# Patient Record
Sex: Male | Born: 1960 | Race: White | Hispanic: No | Marital: Married | Smoking: Never smoker
Health system: Southern US, Community
[De-identification: ages and names within clinical notes are randomized; demographics above are authoritative.]

## PROBLEM LIST (undated history)

## (undated) DIAGNOSIS — Z9989 Dependence on other enabling machines and devices: Secondary | ICD-10-CM

## (undated) DIAGNOSIS — M1990 Unspecified osteoarthritis, unspecified site: Secondary | ICD-10-CM

## (undated) DIAGNOSIS — Z973 Presence of spectacles and contact lenses: Secondary | ICD-10-CM

## (undated) DIAGNOSIS — N400 Enlarged prostate without lower urinary tract symptoms: Secondary | ICD-10-CM

## (undated) DIAGNOSIS — G4730 Sleep apnea, unspecified: Secondary | ICD-10-CM

## (undated) DIAGNOSIS — Z9289 Personal history of other medical treatment: Secondary | ICD-10-CM

## (undated) DIAGNOSIS — S46219A Strain of muscle, fascia and tendon of other parts of biceps, unspecified arm, initial encounter: Secondary | ICD-10-CM

## (undated) DIAGNOSIS — K449 Diaphragmatic hernia without obstruction or gangrene: Secondary | ICD-10-CM

## (undated) DIAGNOSIS — I10 Essential (primary) hypertension: Secondary | ICD-10-CM

## (undated) DIAGNOSIS — Z8719 Personal history of other diseases of the digestive system: Secondary | ICD-10-CM

## (undated) DIAGNOSIS — L502 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 and heat: Secondary | ICD-10-CM

## (undated) DIAGNOSIS — G43909 Migraine, unspecified, not intractable, without status migrainosus: Secondary | ICD-10-CM

## (undated) DIAGNOSIS — K5790 Diverticulosis of intestine, part unspecified, without perforation or abscess without bleeding: Secondary | ICD-10-CM

## (undated) DIAGNOSIS — E785 Hyperlipidemia, unspecified: Secondary | ICD-10-CM

## (undated) DIAGNOSIS — Z8709 Personal history of other diseases of the respiratory system: Secondary | ICD-10-CM

## (undated) DIAGNOSIS — J302 Other seasonal allergic rhinitis: Secondary | ICD-10-CM

## (undated) DIAGNOSIS — A159 Respiratory tuberculosis unspecified: Secondary | ICD-10-CM

## (undated) DIAGNOSIS — G4733 Obstructive sleep apnea (adult) (pediatric): Secondary | ICD-10-CM

## (undated) HISTORY — DX: Hyperlipidemia, unspecified: E78.5

## (undated) HISTORY — DX: Migraine, unspecified, not intractable, without status migrainosus: G43.909

## (undated) HISTORY — DX: Essential (primary) hypertension: I10

## (undated) HISTORY — DX: Diverticulosis of intestine, part unspecified, without perforation or abscess without bleeding: K57.90

## (undated) HISTORY — DX: Benign prostatic hyperplasia without lower urinary tract symptoms: N40.0

## (undated) HISTORY — DX: Unspecified osteoarthritis, unspecified site: M19.90

## (undated) HISTORY — PX: COLONOSCOPY: SHX174

## (undated) HISTORY — DX: Sleep apnea, unspecified: G47.30

## (undated) HISTORY — PX: UMBILICAL HERNIA REPAIR: SHX196

## (undated) HISTORY — DX: Respiratory tuberculosis unspecified: A15.9

## (undated) HISTORY — PX: HERNIA REPAIR: SHX51

---
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
## 1996-08-13 HISTORY — PX: CHOLECYSTECTOMY: SHX55

## 1996-08-13 HISTORY — PX: LAPAROSCOPIC CHOLECYSTECTOMY: SUR755

## 2015-08-14 HISTORY — PX: UMBILICAL HERNIA REPAIR: SHX196

## 2015-09-13 LAB — LIPID PANEL
Cholesterol: 182 mg/dL (ref 0–200)
HDL: 49 mg/dL (ref 35–70)
LDL CALC: 105 mg/dL
TRIGLYCERIDES: 141 mg/dL (ref 40–160)

## 2015-09-13 LAB — HEMOGLOBIN A1C: HEMOGLOBIN A1C: 5.5

## 2015-09-13 LAB — BASIC METABOLIC PANEL: Glucose: 92 mg/dL

## 2016-03-26 LAB — BASIC METABOLIC PANEL: Glucose: 124 mg/dL

## 2016-04-11 ENCOUNTER — Ambulatory Visit (INDEPENDENT_AMBULATORY_CARE_PROVIDER_SITE_OTHER): Payer: PRIVATE HEALTH INSURANCE | Admitting: Family Medicine

## 2016-04-11 ENCOUNTER — Encounter: Payer: Self-pay | Admitting: Family Medicine

## 2016-04-11 VITALS — BP 124/80 | HR 60 | Resp 18 | Ht 76.25 in | Wt 233.0 lb

## 2016-04-11 DIAGNOSIS — I10 Essential (primary) hypertension: Secondary | ICD-10-CM | POA: Insufficient documentation

## 2016-04-11 DIAGNOSIS — N400 Enlarged prostate without lower urinary tract symptoms: Secondary | ICD-10-CM

## 2016-04-11 DIAGNOSIS — L502 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 and heat: Secondary | ICD-10-CM

## 2016-04-11 DIAGNOSIS — Z23 Encounter for immunization: Secondary | ICD-10-CM | POA: Diagnosis not present

## 2016-04-11 DIAGNOSIS — Z7689 Persons encountering health services in other specified circumstances: Secondary | ICD-10-CM

## 2016-04-11 DIAGNOSIS — Z91048 Other nonmedicinal substance allergy status: Secondary | ICD-10-CM

## 2016-04-11 DIAGNOSIS — G43909 Migraine, unspecified, not intractable, without status migrainosus: Secondary | ICD-10-CM

## 2016-04-11 DIAGNOSIS — G4733 Obstructive sleep apnea (adult) (pediatric): Secondary | ICD-10-CM

## 2016-04-11 DIAGNOSIS — Z7189 Other specified counseling: Secondary | ICD-10-CM

## 2016-04-11 DIAGNOSIS — F5104 Psychophysiologic insomnia: Secondary | ICD-10-CM | POA: Insufficient documentation

## 2016-04-11 DIAGNOSIS — G4700 Insomnia, unspecified: Secondary | ICD-10-CM

## 2016-04-11 DIAGNOSIS — Z9109 Other allergy status, other than to drugs and biological substances: Secondary | ICD-10-CM | POA: Insufficient documentation

## 2016-04-11 DIAGNOSIS — Z9989 Dependence on other enabling machines and devices: Secondary | ICD-10-CM | POA: Insufficient documentation

## 2016-04-11 NOTE — Patient Instructions (Signed)
Need release for old records from primary care doctor  Follow up with neurologist as recomended  Good work with the healthy diet and exercise  Flu shot today   I will refer for sleep consult  See me in January for a physical and lab work

## 2016-04-11 NOTE — Progress Notes (Signed)
Chief Complaint  Patient presents with  . Establish Care    previously pcp was Dr. Shawnee KnappLevy in [place]   Patient is a pleasant 55 year old gentleman here to establish as a new patient. 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 [facility] and these records are requested.  Patient is under the care of a neurologist for active migraine condition. He has migraines at least 6 times a month. He has been referred to a new neurologist in [place] and has an appointment next month. He uses the zolpidem as needed for migraine headaches, sleep helps the headaches.  He was diagnosed with sleep apnea many years ago. He is compliant with his CPAP. He requests a reevaluation by sleep medicine doctor. A referral will be placed for him.  He has well-controlled hypertension. No other cardiovascular disease, heart disease.  He sees an eye doctor yearly. He goes to a dentist regularly. He eats well and exercises. Immunizations are up-to-date. Health maintenance is up-to-date.  Patient Active Problem List   Diagnosis Date Noted  . Essential hypertension 04/11/2016  . Migraine 04/11/2016  . 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 Environmental allergies 04/11/2016  . BPH (benign prostatic hyperplasia) 04/11/2016  . OSA on CPAP 04/11/2016  . Chronic insomnia 04/11/2016    Outpatient Encounter Prescriptions as of 04/11/2016  Medication Sig  . butorphanol (STADOL) 10 MG/ML nasal spray Place 1 spray into the nose as needed.  Marland Kitchen. lisinopril (PRINIVIL,ZESTRIL) 10 MG tablet Take 10 mg by mouth daily.  . Multiple Vitamin (MULTIVITAMIN) tablet Take 1 tablet by mouth daily.  . Omega-3 Fatty Acids (FISH OIL) 1000 MG CAPS Take by mouth.  . SUMAtriptan (IMITREX) 100 MG tablet   . SUMAtriptan (IMITREX) 6 MG/0.5ML SOLN injection INJECT 1 VIAL SUBCUTANEOUSLY AT ONSET OF HEADACHE. MAY REPEAT 1 TIME 1 HOUR LATER. MAX OF 2 DOSES IN 24 HOURS PRF MIGRAINES.   Marland Kitchen. tamsulosin (FLOMAX) 0.4 MG CAPS capsule Take 0.4 mg by mouth daily.  Marland Kitchen. triamcinolone (NASACORT ALLERGY 24HR) 55 MCG/ACT AERO nasal inhaler Place 2 sprays into the nose daily.  Marland Kitchen. zolpidem (AMBIEN) 10 MG tablet Take 10 mg by mouth at bedtime as needed for sleep.   No facility-administered encounter medications on file as of 04/11/2016.    Family History  Problem Relation Age of Onset  . Mental illness Mother     died of trauma  . Cancer Father     kidney  . Thalassemia Father   . Thalassemia Brother   . Mental illness Son   . Mental illness Brother     opiod overdose  . Cancer Paternal Grandfather     stomach  . Alcohol abuse Paternal Grandfather    Social History   Social History  . Marital status: Married    Spouse name: N/A  . Number of children: 2 sons  . Years of education: N/A   Occupational History  . Not on file.   Social History Main Topics  . Smoking status: Never Smoker  . Smokeless tobacco: Former NeurosurgeonUser    Types: Snuff  . Alcohol use No  . Drug use: No  . Sexual activity: Yes    Birth control/ protection: Post-menopausal   Past Surgical History:  Procedure Laterality Date  . CHOLECYSTECTOMY  1998  . HERNIA REPAIR     umbilical  . TONSILECTOMY, ADENOIDECTOMY, BILATERAL MYRINGOTOMY AND TUBES  1968  . UMBILICAL HERNIA REPAIR  08/2015  BP 124/80   Pulse 60   Resp 18   Ht 6' 4.25" (1.937 m)   Wt 233 lb (105.7 kg)   SpO2 98%   BMI 28.18 kg/m  Review of Systems  Constitutional: Negative for chills, fever and weight loss.  HENT: Negative for congestion and hearing loss.   Eyes: Negative for blurred vision and pain.  Respiratory: Negative for cough and shortness of breath.   Cardiovascular: Negative for chest pain and leg swelling.  Gastrointestinal: Negative for abdominal pain, constipation, diarrhea and heartburn.  Genitourinary: Negative for dysuria and frequency.  Musculoskeletal: Negative for falls, joint pain and myalgias.  Skin:        "bump" on back  Neurological: Negative for dizziness, seizures and headaches.  Psychiatric/Behavioral: Negative for depression. The patient has insomnia. The patient is not nervous/anxious.    Physical Exam  Constitutional: He is oriented to person, place, and time. He appears well-developed and well-nourished.  HENT:  Head: Normocephalic and atraumatic.  Right Ear: External ear normal.  Left Ear: External ear normal.  Mouth/Throat: Oropharynx is clear and moist.  Eyes: Conjunctivae are normal. Pupils are equal, round, and reactive to light.  Mild strabismus  Neck: Normal range of motion. Neck supple. No thyromegaly present.  Cardiovascular: Normal rate, regular rhythm and normal heart sounds.   Pulmonary/Chest: Effort normal and breath sounds normal.  Abdominal: Soft. Bowel sounds are normal. He exhibits no distension. There is no tenderness.  Musculoskeletal: Normal range of motion. He exhibits no edema.  Lymphadenopathy:    He has no cervical adenopathy.  Neurological: He is alert and oriented to person, place, and time.  Skin: Skin is warm and dry.  Soft mobile mass sub cutaneous overlying R scapula  Psychiatric: He has a normal mood and affect. His behavior is normal. Judgment and thought content normal.   1. Encounter to establish care with new doctor   2. Essential hypertension  - COMPLETE METABOLIC PANEL WITH GFR - Lipid Profile - Vitamin D (25 hydroxy)  3. Migraine without status migrainosus, not intractable, unspecified migraine type Continue seeing neurology  4. 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 Environmental allergies nasocort  6. BPH (benign prostatic hyperplasia) tamulosin  7. OSA on CPAP Referral placed  8. Encounter for immunization  - Flu Vaccine QUAD 36+ mos IM  9. Chronic insomnia Discussed prn zolpidem for headache and sleep  Patient Instructions  Need release for old records from primary care doctor  Follow up with neurologist as  recomended  Good work with the healthy diet and exercise  Flu shot today   I will refer for sleep consult  See me in January for a physical and lab work

## 2016-04-17 ENCOUNTER — Encounter: Payer: Self-pay | Admitting: Family Medicine

## 2016-04-17 DIAGNOSIS — K5732 Diverticulitis of large intestine without perforation or abscess without bleeding: Secondary | ICD-10-CM | POA: Insufficient documentation

## 2016-05-10 ENCOUNTER — Encounter: Payer: Self-pay | Admitting: Family Medicine

## 2016-05-10 ENCOUNTER — Ambulatory Visit (INDEPENDENT_AMBULATORY_CARE_PROVIDER_SITE_OTHER): Payer: PRIVATE HEALTH INSURANCE | Admitting: Family Medicine

## 2016-05-10 VITALS — BP 120/64 | HR 70 | Resp 18 | Ht 76.25 in | Wt 231.0 lb

## 2016-05-10 DIAGNOSIS — E785 Hyperlipidemia, unspecified: Secondary | ICD-10-CM

## 2016-05-10 DIAGNOSIS — N3090 Cystitis, unspecified without hematuria: Secondary | ICD-10-CM

## 2016-05-10 DIAGNOSIS — Z9289 Personal history of other medical treatment: Secondary | ICD-10-CM | POA: Insufficient documentation

## 2016-05-10 HISTORY — DX: Hyperlipidemia, unspecified: E78.5

## 2016-05-10 LAB — POCT URINALYSIS DIPSTICK
Bilirubin, UA: NEGATIVE
Blood, UA: NEGATIVE
Glucose, UA: NEGATIVE
Ketones, UA: NEGATIVE
LEUKOCYTES UA: NEGATIVE
Nitrite, UA: NEGATIVE
PH UA: 6
PROTEIN UA: NEGATIVE
Spec Grav, UA: 1.025
UROBILINOGEN UA: 0.2

## 2016-05-10 MED ORDER — CIPROFLOXACIN HCL 500 MG PO TABS: 500.0000 mg | ORAL_TABLET | Freq: Two times a day (BID) | ORAL | 0 refills | Status: DC

## 2016-05-10 NOTE — Progress Notes (Signed)
Chief Complaint  Patient presents with  . Urinary Tract Infection    symptoms x 3 days of frequency and urgency    Patient has been on Flomax for many years for 9 prostatic hypertrophy. He has not had any symptoms of frequency and nocturia. He has never had a urinary tract infection before. He states that for the last 3 days he's noted that he has more frequent urination. No burning. No hematuria. No discolored or foul-smelling urine. No abdominal or suprapubic pain. No fever or chills. No flank pain. 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 It was recommended by the occupational physician that he go to his primary care doctor for evaluation.  Patient Active Problem List   Diagnosis Date Noted  . 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 HLD (hyperlipidemia) 05/10/2016  . Diverticulitis of colon 04/17/2016  . Essential hypertension 04/11/2016  . Migraine 04/11/2016  . 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 Environmental allergies 04/11/2016  . BPH (benign prostatic hyperplasia) 04/11/2016  . OSA on CPAP 04/11/2016  . Chronic insomnia 04/11/2016    Outpatient Encounter Prescriptions as of 05/10/2016  Medication Sig  . butorphanol (STADOL) 10 MG/ML nasal spray Place 1 spray into the nose as needed.  Marland Kitchen lisinopril (PRINIVIL,ZESTRIL) 10 MG tablet Take 10 mg by mouth daily.  . Multiple Vitamin (MULTIVITAMIN) tablet Take 1 tablet by mouth daily.  . Omega-3 Fatty Acids (FISH OIL) 1000 MG CAPS Take by mouth.  . SUMAtriptan (IMITREX) 100 MG tablet   . SUMAtriptan (IMITREX) 6 MG/0.5ML SOLN injection INJECT 1 VIAL SUBCUTANEOUSLY AT ONSET OF HEADACHE. MAY REPEAT 1 TIME 1 HOUR LATER. MAX OF 2 DOSES IN 24 HOURS PRF MIGRAINES.  Marland Kitchen tamsulosin (FLOMAX) 0.4 MG CAPS capsule Take 0.4 mg by mouth daily.  Marland Kitchen triamcinolone (NASACORT ALLERGY 24HR) 55 MCG/ACT AERO nasal inhaler Place 2 sprays into the nose daily.  Marland Kitchen zolpidem (AMBIEN) 10 MG tablet Take 10  mg by mouth at bedtime as needed for sleep.  . ciprofloxacin (CIPRO) 500 MG tablet Take 1 tablet (500 mg total) by mouth 2 (two) times daily.   No facility-administered encounter medications on file as of 05/10/2016.     Allergies  Allergen Reactions  . Amlodipine Swelling  . Nifedipine Swelling and Other (See Comments)    Daily migraines     Review of Systems  Constitutional: Negative for activity change, chills and fever.  HENT: Negative.  Negative for congestion and sore throat.   Eyes: Negative.  Negative for visual disturbance.  Respiratory: Negative.  Negative for cough and shortness of breath.   Cardiovascular: Negative.  Negative for chest pain, palpitations and leg swelling.  Gastrointestinal: Negative.  Negative for abdominal pain, nausea and vomiting.  Genitourinary: Positive for frequency. Negative for difficulty urinating, dysuria, flank pain and hematuria.  Musculoskeletal: Negative.  Negative for back pain.  Neurological: Negative.  Negative for dizziness and headaches.  Psychiatric/Behavioral: Negative.      BP 120/64   Pulse 70   Resp 18   Ht 6' 4.25" (1.937 m)   Wt 231 lb (104.8 kg)   SpO2 98%   BMI 27.93 kg/m   Physical Exam  Constitutional: He is oriented to person, place, and time. He appears well-developed and well-nourished. No distress.  HENT:  Head: Normocephalic.  Mouth/Throat: Oropharynx is clear and moist.  Eyes: Conjunctivae are normal. Pupils are equal, round, and reactive to light.  Neck:  Normal range of motion.  Cardiovascular: Normal rate, regular rhythm and normal heart sounds.   Pulmonary/Chest: Effort normal and breath sounds normal.  No CVAT  Abdominal: Soft. Bowel sounds are normal. There is no tenderness.  No organomegaly  Lymphadenopathy:    He has no cervical adenopathy.  Neurological: He is alert and oriented to person, place, and time.  Skin: Skin is warm and dry. No rash noted.  Psychiatric: He has a normal mood and  affect. His behavior is normal. Thought content normal.   Urinalysis in this office is negative. We'll send for culture ASSESSMENT/PLAN:  1. Cystitis  - POCT urinalysis dipstick - Urine culture  2. HLD (hyperlipidemia) Discussed that the patient has a Framingham risk of 13.2 based on his old records received. He has well-controlled hypertension. No family history. He does exercise regularly. He feels well. We did discuss cholesterol foods. He does not want to start a statin at this time, prefers to try harder with diet and exercise. He like to lose a little weight. We'll recheck his lipid panel in a few months.  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 He states he's never been treated for tuberculosis. With his next blood draw I am going to order a QuantiFERON Gold test and decide how this should be managed.   Patient Instructions  Take the cipro as directed Push fluids Call for problems   Eustace MooreYvonne Sue Herb Beltre, MD

## 2016-05-10 NOTE — Patient Instructions (Signed)
Take the cipro as directed Push fluids Call for problems

## 2016-05-11 ENCOUNTER — Other Ambulatory Visit (HOSPITAL_COMMUNITY)
Admission: RE | Admit: 2016-05-11 | Discharge: 2016-05-11 | Disposition: A | Discharge status: Home / Self Care | Payer: PRIVATE HEALTH INSURANCE | Source: Other Acute Inpatient Hospital | Attending: Family Medicine | Admitting: Family Medicine

## 2016-05-11 DIAGNOSIS — N3090 Cystitis, unspecified without hematuria: Secondary | ICD-10-CM | POA: Diagnosis present

## 2016-05-13 LAB — URINE CULTURE: CULTURE: NO GROWTH

## 2016-05-14 ENCOUNTER — Encounter: Payer: Self-pay | Admitting: Family Medicine

## 2016-07-04 ENCOUNTER — Telehealth: Payer: Self-pay | Admitting: Family Medicine

## 2016-07-04 ENCOUNTER — Other Ambulatory Visit: Payer: Self-pay

## 2016-07-04 ENCOUNTER — Other Ambulatory Visit: Payer: Self-pay | Admitting: Family Medicine

## 2016-07-04 MED ORDER — LISINOPRIL 10 MG PO TABS: 10.0000 mg | ORAL_TABLET | Freq: Every day | ORAL | 4 refills | Status: DC

## 2016-07-04 NOTE — Telephone Encounter (Signed)
Patient is calling requesting that his lisinopril (PRINIVIL,ZESTRIL) 10 MG tablet be refilled he is out

## 2016-07-04 NOTE — Telephone Encounter (Signed)
Med sent in.

## 2016-08-31 ENCOUNTER — Ambulatory Visit (INDEPENDENT_AMBULATORY_CARE_PROVIDER_SITE_OTHER): Payer: PRIVATE HEALTH INSURANCE | Admitting: Family Medicine

## 2016-08-31 ENCOUNTER — Encounter: Payer: Self-pay | Admitting: Family Medicine

## 2016-08-31 VITALS — BP 110/76 | HR 88 | Temp 98.8°F | Resp 20 | Ht 76.25 in | Wt 233.1 lb

## 2016-08-31 DIAGNOSIS — B349 Viral infection, unspecified: Secondary | ICD-10-CM

## 2016-08-31 LAB — POCT RAPID STREP A (OFFICE): Rapid Strep A Screen: NEGATIVE

## 2016-08-31 MED ORDER — OSELTAMIVIR PHOSPHATE 75 MG PO CAPS: 75.0000 mg | ORAL_CAPSULE | Freq: Two times a day (BID) | ORAL | 0 refills | Status: DC

## 2016-08-31 NOTE — Progress Notes (Signed)
Chief Complaint  Patient presents with  . Sore Throat  . Fever    x 2 days   Since Wednesday sore throat, cough, congestion, clear mucous, headache, body aches, fever and chills No purulent sinus drainage No productive cough Feels weak and aches, joints hurt  Patient Active Problem List   Diagnosis Date Noted  . 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 HLD (hyperlipidemia) 05/10/2016  . Diverticulitis of colon 04/17/2016  . Essential hypertension 04/11/2016  . Migraine 04/11/2016  . 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 Environmental allergies 04/11/2016  . BPH (benign prostatic hyperplasia) 04/11/2016  . OSA on CPAP 04/11/2016  . Chronic insomnia 04/11/2016    Outpatient Encounter Prescriptions as of 08/31/2016  Medication Sig  . butorphanol (STADOL) 10 MG/ML nasal spray Place 1 spray into the nose as needed.  Marland Kitchen. lisinopril (PRINIVIL,ZESTRIL) 10 MG tablet Take 1 tablet (10 mg total) by mouth daily.  . Multiple Vitamin (MULTIVITAMIN) tablet Take 1 tablet by mouth daily.  . Omega-3 Fatty Acids (FISH OIL) 1000 MG CAPS Take by mouth.  . SUMAtriptan (IMITREX) 100 MG tablet   . SUMAtriptan (IMITREX) 6 MG/0.5ML SOLN injection INJECT 1 VIAL SUBCUTANEOUSLY AT ONSET OF HEADACHE. MAY REPEAT 1 TIME 1 HOUR LATER. MAX OF 2 DOSES IN 24 HOURS PRF MIGRAINES.  Marland Kitchen. tamsulosin (FLOMAX) 0.4 MG CAPS capsule Take 0.4 mg by mouth daily.  Marland Kitchen. triamcinolone (NASACORT ALLERGY 24HR) 55 MCG/ACT AERO nasal inhaler Place 2 sprays into the nose daily.  Marland Kitchen. zolpidem (AMBIEN) 10 MG tablet Take 10 mg by mouth at bedtime as needed for sleep.  . Insulin Syringe-Needle U-100 (INSULIN SYRINGE .5CC/31GX5/16") 31G X 5/16" 0.5 ML MISC TO BE USED WITH SUMATRIPTAN INJECTION  . oseltamivir (TAMIFLU) 75 MG capsule Take 1 capsule (75 mg total) by mouth 2 (two) times daily.   No facility-administered encounter medications on file as of 08/31/2016.     Allergies  Allergen Reactions  . Amlodipine Swelling  . Nifedipine Swelling  and Other (See Comments)    Daily migraines     Review of Systems  Constitutional: Positive for activity change, appetite change, chills, diaphoresis, fatigue and fever.  HENT: Positive for congestion, postnasal drip, rhinorrhea, sore throat and trouble swallowing.   Eyes: Negative for redness and visual disturbance.  Respiratory: Positive for cough. Negative for shortness of breath.   Cardiovascular: Negative for chest pain, palpitations and leg swelling.  Gastrointestinal: Negative for diarrhea, nausea and vomiting.  Genitourinary: Negative for difficulty urinating and frequency.  Musculoskeletal: Positive for arthralgias and myalgias.  Neurological: Positive for weakness and headaches.  Psychiatric/Behavioral: Positive for sleep disturbance.    BP 110/76 (BP Location: Right Arm, Patient Position: Sitting, Cuff Size: Large)   Pulse 88   Temp 98.8 F (37.1 C) (Oral)   Resp 20   Ht 6' 4.25" (1.937 m)   Wt 233 lb 1.3 oz (105.7 kg)   SpO2 97%   BMI 28.19 kg/m   Physical Exam  Constitutional: He is oriented to person, place, and time. He appears well-developed and well-nourished.  looks moderately ill, clammy damp skin  HENT:  Head: Normocephalic and atraumatic.  Right Ear: External ear normal.  Left Ear: External ear normal.  Nose: Nose normal.  Post pharynx with moderate erythema  Eyes: Conjunctivae are normal. Pupils are equal, round, and reactive to light.  Neck: Normal range of motion. Neck supple.  Cardiovascular: Normal rate, regular rhythm and normal heart sounds.   Pulmonary/Chest: Effort normal and breath  sounds normal. No respiratory distress. He has no wheezes.  Abdominal: Soft. Bowel sounds are normal. There is no tenderness.  Musculoskeletal: Normal range of motion. He exhibits no edema.  Lymphadenopathy:    He has no cervical adenopathy.  Neurological: He is alert and oriented to person, place, and time.  Skin: He is diaphoretic.  Psychiatric: He has a  normal mood and affect. His behavior is normal. Thought content normal.    ASSESSMENT/PLAN:  1. Viral illness Possible flu, sudden onset with dramatic presentation - POCT rapid strep A= negative   Patient Instructions  Push fluids OTC measures for cold symptoms and sore throat  tamiflu as directed Take 2 doses today  Call for problems   Eustace Moore, MD

## 2016-08-31 NOTE — Patient Instructions (Signed)
Push fluids OTC measures for cold symptoms and sore throat  tamiflu as directed Take 2 doses today  Call for problems

## 2016-09-17 ENCOUNTER — Ambulatory Visit (INDEPENDENT_AMBULATORY_CARE_PROVIDER_SITE_OTHER): Payer: PRIVATE HEALTH INSURANCE | Admitting: Family Medicine

## 2016-09-17 ENCOUNTER — Encounter: Payer: Self-pay | Admitting: Family Medicine

## 2016-09-17 VITALS — BP 124/68 | HR 68 | Temp 98.4°F | Resp 16 | Ht 76.25 in | Wt 230.0 lb

## 2016-09-17 DIAGNOSIS — I10 Essential (primary) hypertension: Secondary | ICD-10-CM

## 2016-09-17 DIAGNOSIS — E785 Hyperlipidemia, unspecified: Secondary | ICD-10-CM | POA: Diagnosis not present

## 2016-09-17 DIAGNOSIS — Z0000 Encounter for general adult medical examination without abnormal findings: Secondary | ICD-10-CM

## 2016-09-17 DIAGNOSIS — N400 Enlarged prostate without lower urinary tract symptoms: Secondary | ICD-10-CM

## 2016-09-17 MED ORDER — TAMSULOSIN HCL 0.4 MG PO CAPS: 0.4000 mg | ORAL_CAPSULE | Freq: Every day | ORAL | 3 refills | Status: DC

## 2016-09-17 NOTE — Patient Instructions (Signed)
Continue to eat well. Low cholesterol heart healthy diet Congratulations on healthy exercise habits  BP control is great  Need to check lipids again in 3-6 months

## 2016-09-17 NOTE — Progress Notes (Addendum)
Chief Complaint  Patient presents with  . Annual Exam   Annual PE Health maintenance up to date No complaints Discussed BP - good control Hyperlipidemia - with Framingham Cardiovascular risk of 13.2% over the next 10 years. I recommend he start a statin. He declines. Wants to work on his diet and check a lipid panel in 3-6 months. His migraines are under fairly good control. He sees a neurologist regularly. Sleep is better on the CPAP, and he is having fewer migraines. We discussed that he has had a positive PPD, treated, and fully evaluated. We discussed PSA screening, pros and cons. He chooses to have a PSA with his next blood work.   Patient Active Problem List   Diagnosis Date Noted  . 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 HLD (hyperlipidemia) 05/10/2016  . Diverticulitis of colon 04/17/2016  . Essential hypertension 04/11/2016  . Migraine 04/11/2016  . 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 Environmental allergies 04/11/2016  . BPH (benign prostatic hyperplasia) 04/11/2016  . OSA on CPAP 04/11/2016  . Chronic insomnia 04/11/2016    Outpatient Encounter Prescriptions as of 09/17/2016  Medication Sig  . butorphanol (STADOL) 10 MG/ML nasal spray Place 1 spray into the nose as needed.  . Insulin Syringe-Needle U-100 (INSULIN SYRINGE .5CC/31GX5/16") 31G X 5/16" 0.5 ML MISC TO BE USED WITH SUMATRIPTAN INJECTION  . lisinopril (PRINIVIL,ZESTRIL) 10 MG tablet Take 1 tablet (10 mg total) by mouth daily.  . Multiple Vitamin (MULTIVITAMIN) tablet Take 1 tablet by mouth daily.  . Omega-3 Fatty Acids (FISH OIL) 1000 MG CAPS Take by mouth.  . SUMAtriptan (IMITREX) 100 MG tablet   . SUMAtriptan (IMITREX) 6 MG/0.5ML SOLN injection INJECT 1 VIAL SUBCUTANEOUSLY AT ONSET OF HEADACHE. MAY REPEAT 1 TIME 1 HOUR LATER. MAX OF 2 DOSES IN 24 HOURS PRF MIGRAINES.  Marland Kitchen tamsulosin (FLOMAX) 0.4 MG CAPS capsule Take 1 capsule (0.4 mg total) by mouth daily.  Marland Kitchen triamcinolone (NASACORT ALLERGY 24HR) 55  MCG/ACT AERO nasal inhaler Place 2 sprays into the nose daily.  Marland Kitchen zolpidem (AMBIEN) 10 MG tablet Take 10 mg by mouth at bedtime as needed for sleep.   No facility-administered encounter medications on file as of 09/17/2016.     Past Medical History:  Diagnosis Date  . Arthritis    osteoarthritis fingers  . BPH without urinary obstruction   . Diverticulosis   . HLD (hyperlipidemia) 05/10/2016  . Hypertension   . Migraines   . Sleep apnea   . Tuberculosis    positive PPD for eyars, never treated    Past Surgical History:  Procedure Laterality Date  . CHOLECYSTECTOMY  1998  . HERNIA REPAIR     umbilical  . TONSILECTOMY, ADENOIDECTOMY, BILATERAL MYRINGOTOMY AND TUBES  1968  . UMBILICAL HERNIA REPAIR  08/2015    Social History   Social History  . Marital status: Married    Spouse name: N/A  . Number of children: N/A  . Years of education: N/A   Occupational History  . Not on file.   Social History Main Topics  . Smoking status: Never Smoker  . Smokeless tobacco: Former Neurosurgeon    Types: Snuff  . Alcohol use No  . Drug use: No  . Sexual activity: Yes    Birth control/ protection: Post-menopausal   Other Topics Concern  . Not on file   Social History Narrative  . No narrative on file    Family History  Problem Relation Age of Onset  . Mental  illness Mother     died of trauma  . Cancer Father     kidney  . Thalassemia Father   . Thalassemia Brother   . Mental illness Son   . Mental illness Brother     opiod overdose  . Cancer Paternal Grandfather     stomach  . Alcohol abuse Paternal Grandfather     Review of Systems  Constitutional: Negative for chills, fever and weight loss.  HENT: Negative for congestion and hearing loss.   Eyes: Negative for blurred vision and pain.  Respiratory: Negative for cough and shortness of breath.   Cardiovascular: Negative for chest pain and leg swelling.  Gastrointestinal: Negative for abdominal pain, constipation,  diarrhea and heartburn.  Genitourinary: Negative for dysuria and frequency.  Musculoskeletal: Negative for falls, joint pain and myalgias.  Neurological: Positive for headaches. Negative for dizziness and seizures.  Psychiatric/Behavioral: Negative for depression. The patient is not nervous/anxious and does not have insomnia.     BP 124/68 (BP Location: Right Arm, Patient Position: Sitting, Cuff Size: Large)   Pulse 68   Temp 98.4 F (36.9 C) (Oral)   Resp 16   Ht 6' 4.25" (1.937 m)   Wt 230 lb 0.6 oz (104.3 kg)   SpO2 97%   BMI 27.82 kg/m   Physical Exam  Constitutional: He is oriented to person, place, and time. He appears well-developed and well-nourished.  HENT:  Head: Normocephalic and atraumatic.  Right Ear: External ear normal.  Left Ear: External ear normal.  Nose: Nose normal.  Mouth/Throat: Oropharynx is clear and moist.  Eyes: Conjunctivae are normal. Pupils are equal, round, and reactive to light.  Mild strabismus. Cornea clear,  Discs flat  Neck: Normal range of motion. Neck supple. No thyromegaly present.  No bruit  Cardiovascular: Normal rate, regular rhythm, normal heart sounds and intact distal pulses.   No murmur heard. Pulmonary/Chest: Effort normal and breath sounds normal. No respiratory distress.  Abdominal: Soft. Bowel sounds are normal. He exhibits no distension. There is no tenderness.  Well-healed umbilical scar. No organomegaly. Inguinal scar  Musculoskeletal: Normal range of motion. He exhibits no edema or deformity.  Lymphadenopathy:    He has no cervical adenopathy.  Neurological: He is alert and oriented to person, place, and time.  Skin: Skin is warm and dry.  Soft mobile mass sub cutaneous overlying R scapula. 1 cm scaling lesion right anterior thigh. Onychomycosis toenails right greater than left  Psychiatric: He has a normal mood and affect. His behavior is normal. Judgment and thought content normal.    1. Annual physical exam   2.  Hyperlipidemia, unspecified hyperlipidemia type   3. Essential hypertension    Patient Instructions  Continue to eat well. Low cholesterol heart healthy diet Congratulations on healthy exercise habits  BP control is great  Need to check lipids again in 3-6 months     Eustace MooreYvonne Sue Aidden Markovic, MD

## 2016-12-06 ENCOUNTER — Other Ambulatory Visit: Payer: Self-pay | Admitting: Family Medicine

## 2017-01-08 ENCOUNTER — Telehealth: Payer: Self-pay | Admitting: Family Medicine

## 2017-01-08 NOTE — Telephone Encounter (Signed)
Patient called in complaining of back spasms, he wanted to make an appt today said he could not wait until tomorrow, Dr.simpson was over booked, so patient said he was going to ER or UC

## 2017-01-15 ENCOUNTER — Encounter: Payer: Self-pay | Admitting: Family Medicine

## 2017-01-15 ENCOUNTER — Ambulatory Visit: Payer: PRIVATE HEALTH INSURANCE | Admitting: Family Medicine

## 2017-01-29 ENCOUNTER — Encounter: Payer: Self-pay | Admitting: Family Medicine

## 2017-01-29 ENCOUNTER — Ambulatory Visit (INDEPENDENT_AMBULATORY_CARE_PROVIDER_SITE_OTHER): Payer: PRIVATE HEALTH INSURANCE | Admitting: Family Medicine

## 2017-01-29 VITALS — BP 118/66 | HR 80 | Temp 98.2°F | Resp 18 | Ht 76.0 in | Wt 235.1 lb

## 2017-01-29 DIAGNOSIS — I10 Essential (primary) hypertension: Secondary | ICD-10-CM

## 2017-01-29 DIAGNOSIS — M62830 Muscle spasm of back: Secondary | ICD-10-CM | POA: Diagnosis not present

## 2017-01-29 DIAGNOSIS — E785 Hyperlipidemia, unspecified: Secondary | ICD-10-CM

## 2017-01-29 LAB — LIPID PANEL
CHOLESTEROL: 166 mg/dL (ref <200)
HDL: 49 mg/dL (ref >40)
LDL CALC: 100 mg/dL — AB (ref <100)
TRIGLYCERIDES: 86 mg/dL (ref <150)
Total CHOL/HDL Ratio: 3.4 Ratio (ref <5.0)
VLDL: 17 mg/dL (ref <30)

## 2017-01-29 LAB — BASIC METABOLIC PANEL WITH GFR
BUN: 20 mg/dL (ref 7–25)
CHLORIDE: 108 mmol/L (ref 98–110)
CO2: 24 mmol/L (ref 20–31)
Calcium: 8.7 mg/dL (ref 8.6–10.3)
Creat: 0.82 mg/dL (ref 0.70–1.33)
GFR, Est African American: >89 mL/min (ref >=60)
Glucose, Bld: 95 mg/dL (ref 65–99)
POTASSIUM: 4.1 mmol/L (ref 3.5–5.3)
SODIUM: 140 mmol/L (ref 135–146)

## 2017-01-29 LAB — PSA: PSA: 0.4 ng/mL (ref <=4.0)

## 2017-01-29 MED ORDER — CYCLOBENZAPRINE HCL 10 MG PO TABS: 10.0000 mg | ORAL_TABLET | Freq: Three times a day (TID) | ORAL | 1 refills | Status: DC | PRN

## 2017-01-29 MED ORDER — NABUMETONE 750 MG PO TABS: 750.0000 mg | ORAL_TABLET | Freq: Every day | ORAL | 1 refills | Status: DC

## 2017-01-29 NOTE — Patient Instructions (Signed)
I will e mail you the test results and any suggestion for [place] follow up I am sending refills on your medicine for future convenience  Call for problems

## 2017-01-29 NOTE — Progress Notes (Signed)
Chief Complaint  Patient presents with  . Follow-up  here for follow up Got labs this morning so they are not available Feels well. Minimal problem with headache.  Still sees neurologist in [place] Is moving to [place] next month and wants a referral to PCP there. BP is good. Does not want to take a Statin for his cholesterol.     Patient Active Problem List   Diagnosis Date Noted  . 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 HLD (hyperlipidemia) 05/10/2016  . Diverticulitis of colon 04/17/2016  . Essential hypertension 04/11/2016  . Migraine 04/11/2016  . 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 Environmental allergies 04/11/2016  . BPH (benign prostatic hyperplasia) 04/11/2016  . OSA on CPAP 04/11/2016  . Chronic insomnia 04/11/2016    Outpatient Encounter Prescriptions as of 01/29/2017  Medication Sig  . aspirin EC 81 MG tablet Take 81 mg by mouth daily.  . butorphanol (STADOL) 10 MG/ML nasal spray Place 1 spray into the nose as needed.  . fluticasone (FLONASE) 50 MCG/ACT nasal spray Place 1 spray into both nostrils daily.  . Insulin Syringe-Needle U-100 (INSULIN SYRINGE .5CC/31GX5/16") 31G X 5/16" 0.5 ML MISC TO BE USED WITH SUMATRIPTAN INJECTION  . lisinopril (PRINIVIL,ZESTRIL) 10 MG tablet TAKE 1 TABLET(10 MG) BY MOUTH DAILY  . Multiple Vitamin (MULTIVITAMIN) tablet Take 1 tablet by mouth daily.  . Omega-3 Fatty Acids (FISH OIL) 1000 MG CAPS Take by mouth.  . SUMAtriptan (IMITREX) 100 MG tablet   . SUMAtriptan (IMITREX) 6 MG/0.5ML SOLN injection INJECT 1 VIAL SUBCUTANEOUSLY AT ONSET OF HEADACHE. MAY REPEAT 1 TIME 1 HOUR LATER. MAX OF 2 DOSES IN 24 HOURS PRF MIGRAINES.  Marland Kitchen tamsulosin (FLOMAX) 0.4 MG CAPS capsule Take 1 capsule (0.4 mg total) by mouth daily.  Marland Kitchen zolpidem (AMBIEN) 10 MG tablet Take 10 mg by mouth at bedtime as needed for sleep.  . cyclobenzaprine (FLEXERIL) 10 MG tablet Take 1 tablet (10 mg total) by mouth 3 (three) times daily as needed for muscle spasms.  .  nabumetone (RELAFEN) 750 MG tablet Take 1 tablet (750 mg total) by mouth daily.  . [DISCONTINUED] triamcinolone (NASACORT ALLERGY 24HR) 55 MCG/ACT AERO nasal inhaler Place 2 sprays into the nose daily.   No facility-administered encounter medications on file as of 01/29/2017.     Allergies  Allergen Reactions  . Amlodipine Swelling  . Nifedipine Swelling and Other (See Comments)    Daily migraines     Review of Systems  Constitutional: Negative for activity change, chills and fever.  HENT: Negative.  Negative for congestion and sore throat.   Eyes: Negative.  Negative for visual disturbance.  Respiratory: Negative.  Negative for cough and shortness of breath.   Cardiovascular: Negative.  Negative for chest pain, palpitations and leg swelling.  Gastrointestinal: Negative.  Negative for abdominal pain, nausea and vomiting.  Genitourinary: Positive for frequency. Negative for difficulty urinating, dysuria, flank pain and hematuria.  Musculoskeletal: Positive for back pain.  Neurological: Positive for headaches. Negative for dizziness.  Psychiatric/Behavioral: Negative.  The patient is not nervous/anxious.    BP 118/66 (BP Location: Right Arm, Patient Position: Sitting, Cuff Size: Large)   Pulse 80   Temp 98.2 F (36.8 C) (Temporal)   Resp 18   Ht 6\' 4"  (1.93 m)   Wt 235 lb 1.9 oz (106.6 kg)   SpO2 97%   BMI 28.62 kg/m   Physical Exam  Constitutional: He is oriented to person, place, and time. He appears well-developed and  well-nourished. No distress.  HENT:  Head: Normocephalic and atraumatic.  Mouth/Throat: Oropharynx is clear and moist.  Eyes: Conjunctivae are normal. Pupils are equal, round, and reactive to light.  Neck: Normal range of motion. No thyromegaly present.  Cardiovascular: Normal rate, regular rhythm and normal heart sounds.   Pulmonary/Chest: Effort normal and breath sounds normal.  Abdominal: Soft. Bowel sounds are normal.  Musculoskeletal: Normal range of  motion. He exhibits no edema.  Tender lumbar muscles with slight spasm  Lymphadenopathy:    He has no cervical adenopathy.  Neurological: He is alert and oriented to person, place, and time.  Psychiatric: He has a normal mood and affect. His behavior is normal. Thought content normal.    ASSESSMENT/PLAN:  1. Hyperlipidemia, unspecified hyperlipidemia type Diet and fish oil  2. Essential hypertension controlled  3. Spasm of back muscles improving   Patient Instructions  I will e mail you the test results and any suggestion for Scott follow up I am sending refills on your medicine for future convenience  Call for problems   Tyler MooreYvonne Sue Ido Wollman, MD

## 2017-06-05 ENCOUNTER — Other Ambulatory Visit: Payer: Self-pay | Admitting: Family Medicine

## 2017-08-24 ENCOUNTER — Other Ambulatory Visit: Payer: Self-pay | Admitting: Family Medicine

## 2017-08-26 NOTE — Telephone Encounter (Signed)
Seen [phone] 

## 2017-10-21 ENCOUNTER — Encounter: Payer: Self-pay | Admitting: Family Medicine

## 2017-11-09 ENCOUNTER — Encounter (HOSPITAL_COMMUNITY): Payer: Self-pay | Admitting: Emergency Medicine

## 2017-11-09 ENCOUNTER — Other Ambulatory Visit: Payer: Self-pay

## 2017-11-09 ENCOUNTER — Ambulatory Visit (HOSPITAL_COMMUNITY)
Admission: EM | Admit: 2017-11-09 | Discharge: 2017-11-09 | Disposition: A | Discharge status: Home / Self Care | Payer: PRIVATE HEALTH INSURANCE | Attending: Family Medicine | Admitting: Family Medicine

## 2017-11-09 DIAGNOSIS — J0140 Acute pansinusitis, unspecified: Secondary | ICD-10-CM | POA: Diagnosis not present

## 2017-11-09 MED ORDER — AMOXICILLIN-POT CLAVULANATE 875-125 MG PO TABS: 1.0000 | ORAL_TABLET | Freq: Two times a day (BID) | ORAL | 0 refills | Status: DC

## 2017-11-09 MED ORDER — IPRATROPIUM BROMIDE 0.06 % NA SOLN: 2.0000 | Freq: Four times a day (QID) | NASAL | 0 refills | Status: DC

## 2017-11-09 MED ORDER — PREDNISONE 20 MG PO TABS: 40.0000 mg | ORAL_TABLET | Freq: Every day | ORAL | 0 refills | Status: AC

## 2017-11-09 NOTE — ED Provider Notes (Signed)
[facility]    CSN: [id number] Arrival date & time: 11/09/17  1631     History   Chief Complaint Chief Complaint  Patient presents with  . URI    HPI Tyler Galvan is a 57 y.o. male.   56 year old male comes in for 1.5 week history of URI symptoms. Has had productive cough, rhinorrhea, nasal congestion, facial pain/pressure. Pressure around the eyes. Sneezed this morning, which caused epistaxis, controlled with pressure. Denies fever, chills, night sweats. otc cold medicine without relief. Zyrtec and nasonex without relief. Never smoker.      Past Medical History:  Diagnosis Date  . Arthritis    osteoarthritis fingers  . BPH without urinary obstruction   . Diverticulosis   . HLD (hyperlipidemia) 05/10/2016  . Hypertension   . Migraines   . Sleep apnea   . Tuberculosis    positive PPD for eyars, never treated    Patient Active Problem List   Diagnosis Date Noted  . 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 HLD (hyperlipidemia) 05/10/2016  . Diverticulitis of colon 04/17/2016  . Essential hypertension 04/11/2016  . Migraine 04/11/2016  . 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 Environmental allergies 04/11/2016  . BPH (benign prostatic hyperplasia) 04/11/2016  . OSA on CPAP 04/11/2016  . Chronic insomnia 04/11/2016    Past Surgical History:  Procedure Laterality Date  . CHOLECYSTECTOMY  1998  . HERNIA REPAIR     umbilical  . TONSILECTOMY, ADENOIDECTOMY, BILATERAL MYRINGOTOMY AND TUBES  1968  . UMBILICAL HERNIA REPAIR  08/2015       Home Medications    Prior to Admission medications   Medication Sig Start Date End Date Taking? Authorizing Provider  aspirin EC 81 MG tablet Take 81 mg by mouth daily.   Yes [provider]  fluticasone (FLONASE) 50 MCG/ACT nasal spray Place 1 spray into both nostrils daily.   Yes [provider]  lisinopril (PRINIVIL,ZESTRIL) 10 MG tablet TAKE 1 TABLET(10 MG) BY MOUTH DAILY 08/26/17  Yes Kerri Perches, MD  Multiple Vitamin (MULTIVITAMIN) tablet Take 1 tablet by mouth daily.   Yes [provider]  Omega-3 Fatty Acids (FISH OIL) 1000 MG CAPS Take by mouth.   Yes [provider]  SUMAtriptan (IMITREX) 6 MG/0.5ML SOLN injection INJECT 1 VIAL SUBCUTANEOUSLY AT ONSET OF HEADACHE. MAY REPEAT 1 TIME 1 HOUR LATER. MAX OF 2 DOSES IN 24 HOURS PRF MIGRAINES. 03/13/16  Yes [provider]  tamsulosin (FLOMAX) 0.4 MG CAPS capsule TAKE 1 CAPSULE(0.4 MG) BY MOUTH DAILY 08/26/17  Yes Eustace Moore, MD  amoxicillin-clavulanate (AUGMENTIN) 875-125 MG tablet Take 1 tablet by mouth every 12 (twelve) hours. 11/09/17   Abigial Newville V, PA-C  butorphanol (STADOL) 10 MG/ML nasal spray Place 1 spray into the nose as needed. 03/13/16   [provider]  cyclobenzaprine (FLEXERIL) 10 MG tablet Take 1 tablet (10 mg total) by mouth 3 (three) times daily as needed for muscle spasms. 01/29/17   Eustace Moore, MD  Insulin Syringe-Needle U-100 (INSULIN SYRINGE .5CC/31GX5/16") 31G X 5/16" 0.5 ML MISC TO BE USED WITH SUMATRIPTAN INJECTION 07/02/16   [provider]  ipratropium (ATROVENT) 0.06 % nasal spray Place 2 sprays into both nostrils 4 (four) times daily. 11/09/17   Cathie Hoops, Karis Rilling V, PA-C  nabumetone (RELAFEN) 750 MG tablet Take 1 tablet (750 mg total) by mouth daily. 01/29/17   Eustace Moore, MD  predniSONE (DELTASONE) 20 MG tablet Take 2 tablets (40 mg  total) by mouth daily for 4 days. 11/09/17 11/13/17  Belinda Fisher, PA-C  SUMAtriptan (IMITREX) 100 MG tablet  03/12/16   [provider]  zolpidem (AMBIEN) 10 MG tablet Take 10 mg by mouth at bedtime as needed for sleep.    [provider]    Family History Family History  Problem Relation Age of Onset  . Mental illness Mother        died of trauma  . Cancer Father        kidney  . Thalassemia Father   . Thalassemia Brother   . Mental illness Son   . Mental illness Brother        opiod overdose  . Cancer  Paternal Grandfather        stomach  . Alcohol abuse Paternal Grandfather     Social History Social History   Tobacco Use  . Smoking status: Never Smoker  . Smokeless tobacco: Former Neurosurgeon    Types: Snuff  Substance Use Topics  . Alcohol use: No  . Drug use: No     Allergies   Amlodipine and Nifedipine   Review of Systems Review of Systems  Reason unable to perform ROS: See HPI as above.     Physical Exam Triage Vital Signs ED Triage Vitals  Enc Vitals Group     BP 11/09/17 1654 129/77     Pulse Rate 11/09/17 1654 79     Resp --      Temp 11/09/17 1654 98.4 F (36.9 C)     Temp Source 11/09/17 1654 Oral     SpO2 11/09/17 1654 98 %     Weight --      Height --      Head Circumference --      Peak Flow --      Pain Score 11/09/17 1650 2     Pain Loc --      Pain Edu? --      Excl. in GC? --    No data found.  Updated Vital Signs BP 129/77 (BP Location: Left Arm)   Pulse 79   Temp 98.4 F (36.9 C) (Oral)   SpO2 98%   Physical Exam  Constitutional: He is oriented to person, place, and time. He appears well-developed and well-nourished. No distress.  HENT:  Head: Normocephalic and atraumatic.  Right Ear: Tympanic membrane, external ear and ear canal normal. Tympanic membrane is not erythematous and not bulging.  Left Ear: Tympanic membrane, external ear and ear canal normal. Tympanic membrane is not erythematous and not bulging.  Nose: Right sinus exhibits maxillary sinus tenderness and frontal sinus tenderness. Left sinus exhibits maxillary sinus tenderness and frontal sinus tenderness.  Mouth/Throat: Uvula is midline and mucous membranes are normal. Posterior oropharyngeal erythema present. No tonsillar exudate.  Eyes: Pupils are equal, round, and reactive to light. Conjunctivae are normal.  Neck: Normal range of motion. Neck supple.  Cardiovascular: Normal rate, regular rhythm and normal heart sounds. Exam reveals no gallop and no friction rub.  No  murmur heard. Pulmonary/Chest: Effort normal and breath sounds normal. He has no decreased breath sounds. He has no wheezes. He has no rhonchi. He has no rales.  Lymphadenopathy:    He has no cervical adenopathy.  Neurological: He is alert and oriented to person, place, and time.  Skin: Skin is warm and dry.  Psychiatric: He has a normal mood and affect. His behavior is normal. Judgment normal.     UC Treatments /  Results  Labs (all labs ordered are listed, but only abnormal results are displayed) Labs Reviewed - No data to display  EKG None Radiology No results found.  Procedures Procedures (including critical care time)  Medications Ordered in UC Medications - No data to display   Initial Impression / Assessment and Plan / UC Course  I have reviewed the triage vital signs and the nursing notes.  Pertinent labs & imaging results that were available during my care of the patient were reviewed by me and considered in my medical decision making (see chart for details).    Augmentin for sinusitis. Prednisone for sinus pressure. Other symptomatic treatment discussed. Push fluids. Return precautions given. Patient expresses understanding and agrees to plan.   Final Clinical Impressions(s) / UC Diagnoses   Final diagnoses:  Acute non-recurrent pansinusitis    ED Discharge Orders        Ordered    amoxicillin-clavulanate (AUGMENTIN) 875-125 MG tablet  Every 12 hours     11/09/17 1749    predniSONE (DELTASONE) 20 MG tablet  Daily     11/09/17 1749    ipratropium (ATROVENT) 0.06 % nasal spray  4 times daily     11/09/17 1749        Belinda FisherYu, Bertie Simien V, PA-C 11/09/17 1755

## 2017-11-09 NOTE — ED Triage Notes (Signed)
Pt reports congestion for over one week and his OTC medications are not helping.  He reports a nose bleed this morning.

## 2017-11-09 NOTE — Discharge Instructions (Signed)
Augmentin for sinus infection. Prednisone to help with sinus pressure. Start  atrovent nasal spray for nasal congestion/drainage. You can use over the counter nasal saline rinse such as neti pot for nasal congestion. Keep hydrated, your urine should be clear to pale yellow in color. Tylenol/motrin for fever and pain. Monitor for any worsening of symptoms, chest pain, shortness of breath, wheezing, swelling of the throat, follow up for reevaluation.

## 2017-11-20 ENCOUNTER — Other Ambulatory Visit: Payer: Self-pay

## 2017-11-20 MED ORDER — LISINOPRIL 10 MG PO TABS: ORAL_TABLET | ORAL | 0 refills | Status: AC

## 2017-11-20 MED ORDER — TAMSULOSIN HCL 0.4 MG PO CAPS: ORAL_CAPSULE | ORAL | 0 refills | Status: AC

## 2018-03-03 ENCOUNTER — Encounter (HOSPITAL_COMMUNITY): Payer: Self-pay | Admitting: Emergency Medicine

## 2018-03-03 ENCOUNTER — Emergency Department (HOSPITAL_COMMUNITY): Payer: PRIVATE HEALTH INSURANCE

## 2018-03-03 ENCOUNTER — Emergency Department (HOSPITAL_COMMUNITY)
Admission: EM | Admit: 2018-03-03 | Discharge: 2018-03-03 | Disposition: A | Discharge status: Home / Self Care | Payer: PRIVATE HEALTH INSURANCE | Attending: Emergency Medicine | Admitting: Emergency Medicine

## 2018-03-03 DIAGNOSIS — R112 Nausea with vomiting, unspecified: Secondary | ICD-10-CM | POA: Diagnosis not present

## 2018-03-03 DIAGNOSIS — R197 Diarrhea, unspecified: Secondary | ICD-10-CM | POA: Insufficient documentation

## 2018-03-03 DIAGNOSIS — R1084 Generalized abdominal pain: Secondary | ICD-10-CM | POA: Diagnosis present

## 2018-03-03 DIAGNOSIS — I10 Essential (primary) hypertension: Secondary | ICD-10-CM | POA: Insufficient documentation

## 2018-03-03 DIAGNOSIS — R109 Unspecified abdominal pain: Secondary | ICD-10-CM

## 2018-03-03 LAB — COMPREHENSIVE METABOLIC PANEL
ALBUMIN: 4.2 g/dL (ref 3.5–5.0)
ALK PHOS: 49 U/L (ref 38–126)
ALT: 28 U/L (ref 0–44)
AST: 24 U/L (ref 15–41)
Anion gap: 9 (ref 5–15)
BILIRUBIN TOTAL: 1.8 mg/dL — AB (ref 0.3–1.2)
BUN: 24 mg/dL — AB (ref 6–20)
CO2: 23 mmol/L (ref 22–32)
CREATININE: 0.81 mg/dL (ref 0.61–1.24)
Calcium: 9.2 mg/dL (ref 8.9–10.3)
Chloride: 108 mmol/L (ref 98–111)
GFR calc Af Amer: >60 mL/min (ref >60)
GFR calc non Af Amer: >60 mL/min (ref >60)
GLUCOSE: 112 mg/dL — AB (ref 70–99)
Potassium: 3.8 mmol/L (ref 3.5–5.1)
Sodium: 140 mmol/L (ref 135–145)
TOTAL PROTEIN: 7.3 g/dL (ref 6.5–8.1)

## 2018-03-03 LAB — URINALYSIS, ROUTINE W REFLEX MICROSCOPIC
BACTERIA UA: NONE SEEN
BILIRUBIN URINE: NEGATIVE
Glucose, UA: NEGATIVE mg/dL
KETONES UR: NEGATIVE mg/dL
LEUKOCYTES UA: NEGATIVE
Nitrite: NEGATIVE
Protein, ur: NEGATIVE mg/dL
Specific Gravity, Urine: 1.026 (ref 1.005–1.030)
pH: 5 (ref 5.0–8.0)

## 2018-03-03 LAB — LIPASE, BLOOD: Lipase: 39 U/L (ref 11–51)

## 2018-03-03 LAB — CBC
HEMATOCRIT: 49.5 % (ref 39.0–52.0)
HEMOGLOBIN: 17.3 g/dL — AB (ref 13.0–17.0)
MCH: 30.6 pg (ref 26.0–34.0)
MCHC: 34.9 g/dL (ref 30.0–36.0)
MCV: 87.5 fL (ref 78.0–100.0)
Platelets: 183 10*3/uL (ref 150–400)
RBC: 5.66 MIL/uL (ref 4.22–5.81)
RDW: 13.8 % (ref 11.5–15.5)
WBC: 8.2 10*3/uL (ref 4.0–10.5)

## 2018-03-03 MED ORDER — IOPAMIDOL (ISOVUE-300) INJECTION 61%
100.0000 mL | Freq: Once | INTRAVENOUS | Status: AC | PRN
  Administered 2018-03-03: 100 mL via INTRAVENOUS

## 2018-03-03 MED ORDER — DIPHENHYDRAMINE HCL 50 MG/ML IJ SOLN
25.0000 mg | Freq: Once | INTRAMUSCULAR | Status: AC
  Administered 2018-03-03: 25 mg via INTRAVENOUS
  Filled 2018-03-03: qty 1

## 2018-03-03 MED ORDER — ONDANSETRON HCL 4 MG/2ML IJ SOLN
4.0000 mg | Freq: Once | INTRAMUSCULAR | Status: AC
  Administered 2018-03-03: 4 mg via INTRAVENOUS
  Filled 2018-03-03: qty 2

## 2018-03-03 MED ORDER — MORPHINE SULFATE (PF) 4 MG/ML IV SOLN
4.0000 mg | Freq: Once | INTRAVENOUS | Status: AC
  Administered 2018-03-03: 4 mg via INTRAVENOUS
  Filled 2018-03-03: qty 1

## 2018-03-03 MED ORDER — METOCLOPRAMIDE HCL 5 MG/ML IJ SOLN
10.0000 mg | Freq: Once | INTRAMUSCULAR | Status: AC
  Administered 2018-03-03: 10 mg via INTRAVENOUS
  Filled 2018-03-03: qty 2

## 2018-03-03 MED ORDER — SODIUM CHLORIDE 0.9 % IV BOLUS
1000.0000 mL | Freq: Once | INTRAVENOUS | Status: AC
  Administered 2018-03-03: 1000 mL via INTRAVENOUS

## 2018-03-03 MED ORDER — LOPERAMIDE HCL 2 MG PO CAPS
4.0000 mg | ORAL_CAPSULE | Freq: Once | ORAL | Status: AC
  Administered 2018-03-03: 4 mg via ORAL
  Filled 2018-03-03: qty 2

## 2018-03-03 MED ORDER — METOCLOPRAMIDE HCL 10 MG PO TABS: 10.0000 mg | ORAL_TABLET | Freq: Four times a day (QID) | ORAL | 0 refills | Status: DC | PRN

## 2018-03-03 MED ORDER — IOPAMIDOL (ISOVUE-300) INJECTION 61%
INTRAVENOUS | Status: AC
  Filled 2018-03-03: qty 100

## 2018-03-03 NOTE — ED Provider Notes (Signed)
[facility]EMERGENCY [facility]PT Provider Note   CSN: [id number] Arrival date & time: 03/03/18  1539     History   Chief Complaint Chief Complaint  Patient presents with  . Abdominal Pain  . Diarrhea    HPI Tyler Galvan is a 57 y.o. male.  The history is provided by the patient.  He has history of hypertension, hyperlipidemia, diverticulitis and comes in complaining of diarrhea since this morning.  He woke up with severe abdominal cramping with associated diarrhea.  He has had an estimated 10 bowel movements today.  There is been no blood or mucus in the stool.  There has been associated nausea with dry heaves but no vomiting.  Abdominal pain is generalized and he rates it at 8/10.  There is momentary improvement after having a bowel movement.  Nothing else makes the pain better or worse.  He denies fever, but has had some chills and sweats as well as generalized body aches.  He has not done anything to treat his symptoms.  He denies any sick contacts or unusual food exposure.  Past Medical History:  Diagnosis Date  . Arthritis    osteoarthritis fingers  . BPH without urinary obstruction   . Diverticulosis   . HLD (hyperlipidemia) 05/10/2016  . Hypertension   . Migraines   . Sleep apnea   . Tuberculosis    positive PPD for eyars, never treated    Patient Active Problem List   Diagnosis Date Noted  . 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 HLD (hyperlipidemia) 05/10/2016  . Diverticulitis of colon 04/17/2016  . Essential hypertension 04/11/2016  . Migraine 04/11/2016  . 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 Environmental allergies 04/11/2016  . BPH (benign prostatic hyperplasia) 04/11/2016  . OSA on CPAP 04/11/2016  . Chronic insomnia 04/11/2016    Past Surgical History:  Procedure Laterality Date  . CHOLECYSTECTOMY  1998  . HERNIA REPAIR     umbilical  . TONSILECTOMY, ADENOIDECTOMY, BILATERAL MYRINGOTOMY AND TUBES  1968  . UMBILICAL HERNIA REPAIR   08/2015        Home Medications    Prior to Admission medications   Medication Sig Start Date End Date Taking? Authorizing Provider  amoxicillin-clavulanate (AUGMENTIN) 875-125 MG tablet Take 1 tablet by mouth every 12 (twelve) hours. 11/09/17   Belinda Fisher, PA-C  aspirin EC 81 MG tablet Take 81 mg by mouth daily.    [provider]  butorphanol (STADOL) 10 MG/ML nasal spray Place 1 spray into the nose as needed. 03/13/16   [provider]  cyclobenzaprine (FLEXERIL) 10 MG tablet Take 1 tablet (10 mg total) by mouth 3 (three) times daily as needed for muscle spasms. 01/29/17   Eustace Moore, MD  fluticasone (FLONASE) 50 MCG/ACT nasal spray Place 1 spray into both nostrils daily.    [provider]  Insulin Syringe-Needle U-100 (INSULIN SYRINGE .5CC/31GX5/16") 31G X 5/16" 0.5 ML MISC TO BE USED WITH SUMATRIPTAN INJECTION 07/02/16   [provider]  ipratropium (ATROVENT) 0.06 % nasal spray Place 2 sprays into both nostrils 4 (four) times daily. 11/09/17   Cathie Hoops, Amy V, PA-C  lisinopril (PRINIVIL,ZESTRIL) 10 MG tablet TAKE 1 TABLET(10 MG) BY MOUTH DAILY 11/20/17   Eustace Moore, MD  Multiple Vitamin (MULTIVITAMIN) tablet Take 1 tablet by mouth daily.    [provider]  nabumetone (RELAFEN) 750 MG tablet Take 1 tablet (750 mg total) by mouth daily. 01/29/17   Eustace Moore,  MD  Omega-3 Fatty Acids (FISH OIL) 1000 MG CAPS Take by mouth.    [provider]  SUMAtriptan (IMITREX) 100 MG tablet  03/12/16   [provider]  SUMAtriptan (IMITREX) 6 MG/0.5ML SOLN injection INJECT 1 VIAL SUBCUTANEOUSLY AT ONSET OF HEADACHE. MAY REPEAT 1 TIME 1 HOUR LATER. MAX OF 2 DOSES IN 24 HOURS PRF MIGRAINES. 03/13/16   [provider]  tamsulosin (FLOMAX) 0.4 MG CAPS capsule TAKE 1 CAPSULE(0.4 MG) BY MOUTH DAILY 11/20/17   Eustace MooreNelson, Yvonne Sue, MD  zolpidem (AMBIEN) 10 MG tablet Take 10 mg by mouth at bedtime as needed for sleep.    [provider]    Family History Family History  Problem Relation Age of Onset  . Mental illness Mother        died of trauma  . Cancer Father        kidney  . Thalassemia Father   . Thalassemia Brother   . Mental illness Son   . Mental illness Brother        opiod overdose  . Cancer Paternal Grandfather        stomach  . Alcohol abuse Paternal Grandfather     Social History Social History   Tobacco Use  . Smoking status: Never Smoker  . Smokeless tobacco: Former NeurosurgeonUser    Types: Snuff  Substance Use Topics  . Alcohol use: No  . Drug use: No     Allergies   Amlodipine and Nifedipine   Review of Systems Review of Systems  All other systems reviewed and are negative.    Physical Exam Updated Vital Signs BP 139/78 (BP Location: Right Arm)   Pulse 89   Temp 98.8 F (37.1 C) (Oral)   Resp (!) 22   Ht 6\' 4"  (1.93 m)   Wt 107 kg (236 lb)   SpO2 100%   BMI 28.73 kg/m   Physical Exam  Nursing note and vitals reviewed.  57 year old male, resting comfortably and in no acute distress. Vital signs are significant for mildly elevated respiratory rate. Oxygen saturation is 100%, which is normal. Head is normocephalic and atraumatic. PERRLA, EOMI. Oropharynx is clear. Neck is nontender and supple without adenopathy or JVD. Back is nontender and there is no CVA tenderness. Lungs are clear without rales, wheezes, or rhonchi. Chest is nontender. Heart has regular rate and rhythm without murmur. Abdomen is soft, flat, with mild to moderate tenderness in the left upper quadrant and left lower quadrant.  There is no rebound or guarding.  There are no masses or hepatosplenomegaly and peristalsis is slightly hypoactive. Extremities have no cyanosis or edema, full range of motion is present. Skin is warm and dry without rash. Neurologic: Mental status is normal, cranial nerves are intact, there are no motor or sensory deficits.  ED Treatments / Results  Labs (all labs  ordered are listed, but only abnormal results are displayed) Labs Reviewed  COMPREHENSIVE METABOLIC PANEL - Abnormal; Notable for the following components:      Result Value   Glucose, Bld 112 (*)    BUN 24 (*)    Total Bilirubin 1.8 (*)    All other components within normal limits  CBC - Abnormal; Notable for the following components:   Hemoglobin 17.3 (*)    All other components within normal limits  URINALYSIS, ROUTINE W REFLEX MICROSCOPIC - Abnormal; Notable for the following components:   Hgb urine dipstick SMALL (*)    All other components within  normal limits  LIPASE, BLOOD   Radiology Ct Abdomen Pelvis W Contrast  Result Date: 03/03/2018 CLINICAL DATA:  Abdominal pain and diarrhea. History of diverticulitis. EXAM: CT ABDOMEN AND PELVIS WITH CONTRAST TECHNIQUE: Multidetector CT imaging of the abdomen and pelvis was performed using the standard protocol following bolus administration of intravenous contrast. CONTRAST:  ISOVUE-300 IOPAMIDOL (ISOVUE-300) INJECTION 61% COMPARISON:  None. FINDINGS: Lower chest: No acute abnormality. Hepatobiliary: Diffuse hepatic steatosis. Status post cholecystectomy. No biliary dilatation. Pancreas: Unremarkable. No pancreatic ductal dilatation or surrounding inflammatory changes. Spleen: Normal in size without focal abnormality. Adrenals/Urinary Tract: The adrenal glands are unremarkable. Multiple parapelvic left renal simple cysts. Tiny bilateral renal calculi. No ureteral calculi or hydronephrosis. The bladder is unremarkable for the degree of distention. Stomach/Bowel: Stomach is within normal limits. Appendix appears normal. Fluid-filled small bowel and colon. No evidence of bowel wall thickening, distention, or inflammatory changes. Extensive sigmoid diverticulosis. There are a few right-sided colonic diverticula. Vascular/Lymphatic: No significant vascular findings are present. No enlarged abdominal or pelvic lymph nodes. Reproductive: Prostate is  unremarkable. Other: No abdominal wall hernia or abnormality. No abdominopelvic ascites. Musculoskeletal: No acute or significant osseous findings. Moderate to severe degenerative disc disease at L5-S1. IMPRESSION: 1. Fluid-filled non-dilated small bowel and colon which likely reflects enterocolitis given clinical history. 2. Colonic diverticulosis without evidence of diverticulitis. 3. Hepatic steatosis. 4. Bilateral nonobstructive nephrolithiasis. Electronically Signed   By: Obie Dredge M.D.   On: 03/03/2018 18:32    Procedures Procedures  Medications Ordered in ED Medications - No data to display   Initial Impression / Assessment and Plan / ED Course  I have reviewed the triage vital signs and the nursing notes.  Pertinent labs & imaging results that were available during my care of the patient were reviewed by me and considered in my medical decision making (see chart for details).  Nausea, vomiting, diarrhea and pattern that seems most consistent with viral gastroenteritis.  However, tenderness is fairly well localized to the left side of the abdomen which is worrisome for possible diverticulitis.  Given his history of diverticulitis, will send for CT of abdomen and pelvis to clarify this.  In the meantime, labs do show elevated BUN consistent with dehydration and he is given IV fluids.  He is given morphine and ondansetron.  Old records are reviewed, and he has no relevant past visits (prior episodes of diverticulitis were in [place], [place] and are not in our computer system or in care everywhere).  CT scan shows evidence of diarrhea but no sign of diverticulitis.  With no sign of diverticulitis, he was given a dose of loperamide..  After initial IV fluids and ondansetron, he continued to have nausea and vomiting and was given metoclopramide with improvement.  He received a total of 8 mg of morphine and 2 L of IV fluid.  At this point, he was feeling much better and was discharged  with prescription for metoclopramide, told to take over-the-counter loperamide.  Return precautions discussed.  Final Clinical Impressions(s) / ED Diagnoses   Final diagnoses:  Nausea vomiting and diarrhea  Abdominal cramping    ED Discharge Orders        Ordered    metoCLOPramide (REGLAN) 10 MG tablet  Every 6 hours PRN     03/03/18 2028       Dione Booze, MD 03/03/18 2039

## 2018-03-03 NOTE — Discharge Instructions (Addendum)
Clear liquid diet until your stomach is feeling better, then gradually advance your diet back to normal over 1-2 days.  Take lopramide (Imodium AD) as needed for diarrhea.

## 2018-03-03 NOTE — ED Triage Notes (Signed)
Pt c/o abd pains with diarrhea that started today. Pt reports hx diverticulitis.

## 2018-03-03 NOTE — ED Notes (Signed)
Pt aware that a urine sample is needed. Pt sts "good luck" when ask about a giving a sample.  Pt sts he will alert staff when he is able to provide a sample.

## 2019-01-27 ENCOUNTER — Other Ambulatory Visit: Payer: Self-pay | Admitting: Orthopedic Surgery

## 2019-01-30 ENCOUNTER — Other Ambulatory Visit (HOSPITAL_COMMUNITY)
Admission: RE | Admit: 2019-01-30 | Discharge: 2019-01-30 | Disposition: A | Discharge status: Home / Self Care | Payer: 59 | Source: Ambulatory Visit | Attending: Orthopedic Surgery | Admitting: Orthopedic Surgery

## 2019-01-30 DIAGNOSIS — Z1159 Encounter for screening for other viral diseases: Secondary | ICD-10-CM | POA: Insufficient documentation

## 2019-01-30 LAB — SARS CORONAVIRUS 2 (TAT 6-24 HRS): SARS Coronavirus 2: NEGATIVE

## 2019-02-02 ENCOUNTER — Other Ambulatory Visit: Payer: Self-pay

## 2019-02-02 ENCOUNTER — Encounter (HOSPITAL_BASED_OUTPATIENT_CLINIC_OR_DEPARTMENT_OTHER): Payer: Self-pay | Admitting: *Deleted

## 2019-02-02 NOTE — Progress Notes (Signed)
Spoke w/ pt via phone for pre-op interview.  Npo after mn w/ exception clear liquids until 0630 (pt did pickup drink at covid testing site).  Arrive at 0730.  Needs istat and ekg.  Will take flomax am dos w/ sips of water, if needed do stadol nasal spray or imitrex injection.  Pt asked to bring cpap tubing and mask.

## 2019-02-02 NOTE — Progress Notes (Signed)

## 2019-02-03 ENCOUNTER — Ambulatory Visit (HOSPITAL_BASED_OUTPATIENT_CLINIC_OR_DEPARTMENT_OTHER)
Admission: RE | Admit: 2019-02-03 | Discharge: 2019-02-03 | Disposition: A | Discharge status: Home / Self Care | Payer: 59 | Attending: Orthopedic Surgery | Admitting: Orthopedic Surgery

## 2019-02-03 ENCOUNTER — Ambulatory Visit (HOSPITAL_BASED_OUTPATIENT_CLINIC_OR_DEPARTMENT_OTHER): Payer: 59 | Admitting: Anesthesiology

## 2019-02-03 ENCOUNTER — Encounter (HOSPITAL_BASED_OUTPATIENT_CLINIC_OR_DEPARTMENT_OTHER): Payer: Self-pay

## 2019-02-03 ENCOUNTER — Encounter (HOSPITAL_BASED_OUTPATIENT_CLINIC_OR_DEPARTMENT_OTHER)
Admission: RE | Disposition: A | Discharge status: Home / Self Care | Payer: Self-pay | Source: Home / Self Care | Attending: Orthopedic Surgery

## 2019-02-03 ENCOUNTER — Other Ambulatory Visit: Payer: Self-pay

## 2019-02-03 DIAGNOSIS — Z7982 Long term (current) use of aspirin: Secondary | ICD-10-CM | POA: Diagnosis not present

## 2019-02-03 DIAGNOSIS — S46212A Strain of muscle, fascia and tendon of other parts of biceps, left arm, initial encounter: Secondary | ICD-10-CM | POA: Insufficient documentation

## 2019-02-03 DIAGNOSIS — G4733 Obstructive sleep apnea (adult) (pediatric): Secondary | ICD-10-CM | POA: Insufficient documentation

## 2019-02-03 DIAGNOSIS — G43909 Migraine, unspecified, not intractable, without status migrainosus: Secondary | ICD-10-CM | POA: Diagnosis not present

## 2019-02-03 DIAGNOSIS — I10 Essential (primary) hypertension: Secondary | ICD-10-CM | POA: Insufficient documentation

## 2019-02-03 DIAGNOSIS — N400 Enlarged prostate without lower urinary tract symptoms: Secondary | ICD-10-CM | POA: Insufficient documentation

## 2019-02-03 DIAGNOSIS — X58XXXA Exposure to other specified factors, initial encounter: Secondary | ICD-10-CM | POA: Insufficient documentation

## 2019-02-03 DIAGNOSIS — J309 Allergic rhinitis, unspecified: Secondary | ICD-10-CM | POA: Insufficient documentation

## 2019-02-03 DIAGNOSIS — Z79899 Other long term (current) drug therapy: Secondary | ICD-10-CM | POA: Diagnosis not present

## 2019-02-03 HISTORY — DX: Personal history of other medical treatment: Z92.89

## 2019-02-03 HISTORY — DX: Strain of muscle, fascia and tendon of other parts of biceps, unspecified arm, initial encounter: S46.219A

## 2019-02-03 HISTORY — DX: Unspecified osteoarthritis, unspecified site: M19.90

## 2019-02-03 HISTORY — DX: Personal history of other diseases of the digestive system: Z87.19

## 2019-02-03 HISTORY — DX: Obstructive sleep apnea (adult) (pediatric): G47.33

## 2019-02-03 HISTORY — DX: Obstructive sleep apnea (adult) (pediatric): Z99.89

## 2019-02-03 HISTORY — DX: Presence of spectacles and contact lenses: Z97.3

## 2019-02-03 HISTORY — DX: Personal history of other diseases of the respiratory system: Z87.09

## 2019-02-03 HISTORY — DX: Diaphragmatic hernia without obstruction or gangrene: K44.9

## 2019-02-03 HISTORY — DX: 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 and heat: L50.2

## 2019-02-03 HISTORY — DX: Other seasonal allergic rhinitis: J30.2

## 2019-02-03 HISTORY — PX: BICEPS TENDON REPAIR: SHX566

## 2019-02-03 LAB — POCT I-STAT 4, (NA,K, GLUC, HGB,HCT)
Glucose, Bld: 98 mg/dL (ref 70–99)
HCT: 39 % (ref 39.0–52.0)
Hemoglobin: 13.3 g/dL (ref 13.0–17.0)
Potassium: 3.6 mmol/L (ref 3.5–5.1)
Sodium: 139 mmol/L (ref 135–145)

## 2019-02-03 SURGERY — REPAIR, TENDON, BICEPS, PROXIMAL
Anesthesia: General | Laterality: Left

## 2019-02-03 MED ORDER — FENTANYL CITRATE (PF) 100 MCG/2ML IJ SOLN
INTRAMUSCULAR | Status: AC
  Filled 2019-02-03: qty 2

## 2019-02-03 MED ORDER — DEXAMETHASONE SODIUM PHOSPHATE 10 MG/ML IJ SOLN
INTRAMUSCULAR | Status: AC
  Filled 2019-02-03: qty 1

## 2019-02-03 MED ORDER — PROPOFOL 10 MG/ML IV BOLUS
INTRAVENOUS | Status: DC | PRN
  Administered 2019-02-03: 100 mg via INTRAVENOUS
  Administered 2019-02-03: 200 mg via INTRAVENOUS

## 2019-02-03 MED ORDER — FENTANYL CITRATE (PF) 100 MCG/2ML IJ SOLN
INTRAMUSCULAR | Status: DC | PRN
  Administered 2019-02-03 (×4): 25 ug via INTRAVENOUS

## 2019-02-03 MED ORDER — PROPOFOL 500 MG/50ML IV EMUL
INTRAVENOUS | Status: AC
  Filled 2019-02-03: qty 50

## 2019-02-03 MED ORDER — CEFAZOLIN SODIUM-DEXTROSE 2-4 GM/100ML-% IV SOLN
INTRAVENOUS | Status: AC
  Filled 2019-02-03: qty 100

## 2019-02-03 MED ORDER — DEXAMETHASONE SODIUM PHOSPHATE 10 MG/ML IJ SOLN
INTRAMUSCULAR | Status: DC | PRN
  Administered 2019-02-03: 10 mg via INTRAVENOUS

## 2019-02-03 MED ORDER — EPHEDRINE 5 MG/ML INJ
INTRAVENOUS | Status: AC
  Filled 2019-02-03: qty 10

## 2019-02-03 MED ORDER — SODIUM CHLORIDE 0.9 % IR SOLN
Status: DC | PRN
  Administered 2019-02-03: 500 mL

## 2019-02-03 MED ORDER — LACTATED RINGERS IV SOLN
INTRAVENOUS | Status: DC
  Administered 2019-02-03: 50 mL/h via INTRAVENOUS
  Administered 2019-02-03: 11:00:00 via INTRAVENOUS
  Filled 2019-02-03: qty 1000

## 2019-02-03 MED ORDER — LIDOCAINE 2% (20 MG/ML) 5 ML SYRINGE
INTRAMUSCULAR | Status: AC
  Filled 2019-02-03: qty 5

## 2019-02-03 MED ORDER — TIZANIDINE HCL 4 MG PO TABS: ORAL_TABLET | ORAL | 1 refills | Status: DC

## 2019-02-03 MED ORDER — EPHEDRINE SULFATE-NACL 50-0.9 MG/10ML-% IV SOSY
PREFILLED_SYRINGE | INTRAVENOUS | Status: DC | PRN
  Administered 2019-02-03 (×2): 10 mg via INTRAVENOUS

## 2019-02-03 MED ORDER — ONDANSETRON HCL 4 MG/2ML IJ SOLN
INTRAMUSCULAR | Status: DC | PRN
  Administered 2019-02-03: 4 mg via INTRAVENOUS

## 2019-02-03 MED ORDER — CEFAZOLIN SODIUM-DEXTROSE 2-4 GM/100ML-% IV SOLN
2.0000 g | INTRAVENOUS | Status: AC
  Administered 2019-02-03: 2 g via INTRAVENOUS
  Filled 2019-02-03: qty 100

## 2019-02-03 MED ORDER — MIDAZOLAM HCL 2 MG/2ML IJ SOLN
INTRAMUSCULAR | Status: AC
  Filled 2019-02-03: qty 2

## 2019-02-03 MED ORDER — LIDOCAINE 2% (20 MG/ML) 5 ML SYRINGE
INTRAMUSCULAR | Status: DC | PRN
  Administered 2019-02-03: 60 mg via INTRAVENOUS

## 2019-02-03 MED ORDER — BUPIVACAINE-EPINEPHRINE (PF) 0.5% -1:200000 IJ SOLN
INTRAMUSCULAR | Status: DC | PRN
  Administered 2019-02-03: 20 mL via PERINEURAL

## 2019-02-03 MED ORDER — OXYCODONE-ACETAMINOPHEN 5-325 MG PO TABS: 1.0000 | ORAL_TABLET | ORAL | 0 refills | Status: DC | PRN

## 2019-02-03 MED ORDER — PROMETHAZINE HCL 25 MG/ML IJ SOLN
6.2500 mg | INTRAMUSCULAR | Status: DC | PRN
  Filled 2019-02-03: qty 1

## 2019-02-03 MED ORDER — OXYCODONE HCL 5 MG/5ML PO SOLN
5.0000 mg | Freq: Once | ORAL | Status: DC | PRN
  Filled 2019-02-03: qty 5

## 2019-02-03 MED ORDER — MIDAZOLAM HCL 2 MG/2ML IJ SOLN
2.0000 mg | Freq: Once | INTRAMUSCULAR | Status: AC
  Administered 2019-02-03: 2 mg via INTRAVENOUS
  Filled 2019-02-03: qty 2

## 2019-02-03 MED ORDER — OXYCODONE HCL 5 MG PO TABS
5.0000 mg | ORAL_TABLET | Freq: Once | ORAL | Status: DC | PRN
  Filled 2019-02-03: qty 1

## 2019-02-03 MED ORDER — CHLORHEXIDINE GLUCONATE 4 % EX LIQD
60.0000 mL | Freq: Once | CUTANEOUS | Status: DC
  Filled 2019-02-03: qty 118

## 2019-02-03 MED ORDER — INDOMETHACIN ER 75 MG PO CPCR: 75.0000 mg | ORAL_CAPSULE | Freq: Every day | ORAL | 0 refills | Status: DC

## 2019-02-03 MED ORDER — FENTANYL CITRATE (PF) 100 MCG/2ML IJ SOLN
25.0000 ug | INTRAMUSCULAR | Status: DC | PRN
  Administered 2019-02-03 (×3): 25 ug via INTRAVENOUS
  Filled 2019-02-03: qty 1

## 2019-02-03 MED ORDER — FENTANYL CITRATE (PF) 100 MCG/2ML IJ SOLN
50.0000 ug | Freq: Once | INTRAMUSCULAR | Status: AC
  Administered 2019-02-03: 50 ug via INTRAVENOUS
  Filled 2019-02-03: qty 1

## 2019-02-03 MED ORDER — ONDANSETRON HCL 4 MG/2ML IJ SOLN
INTRAMUSCULAR | Status: AC
  Filled 2019-02-03: qty 2

## 2019-02-03 SURGICAL SUPPLY — 86 items
BANDAGE ACE 4X5 VEL STRL LF (GAUZE/BANDAGES/DRESSINGS) ×4 IMPLANT
BANDAGE ELASTIC 3 VELCRO ST LF (GAUZE/BANDAGES/DRESSINGS) ×1 IMPLANT
BANDAGE ELASTIC 4 VELCRO ST LF (GAUZE/BANDAGES/DRESSINGS) ×1 IMPLANT
BLADE SURG 15 STRL LF DISP TIS (BLADE) ×1 IMPLANT
BLADE SURG 15 STRL SS (BLADE) ×2
BNDG COHESIVE 4X5 TAN STRL (GAUZE/BANDAGES/DRESSINGS) ×3 IMPLANT
BNDG ESMARK 4X9 LF (GAUZE/BANDAGES/DRESSINGS) IMPLANT
BUTTON DISTAL BICEPS (Orthopedic Implant) ×1 IMPLANT
CANISTER SUCT 1200ML W/VALVE (MISCELLANEOUS) ×1 IMPLANT
CHLORAPREP W/TINT 26 (MISCELLANEOUS) ×3 IMPLANT
CORD BIPOLAR FORCEPS 12FT (ELECTRODE) ×3 IMPLANT
COVER BACK TABLE REUSABLE LG (DRAPES) ×2 IMPLANT
COVER TABLE BACK 60X90 (DRAPES) ×1 IMPLANT
COVER WAND RF STERILE (DRAPES) ×1 IMPLANT
CUFF TOURN SGL QUICK 18X4 (TOURNIQUET CUFF) ×1 IMPLANT
CUFF TOURN SGL QUICK 24 (TOURNIQUET CUFF)
CUFF TRNQT CYL 24X4X16.5-23 (TOURNIQUET CUFF) IMPLANT
DECANTER SPIKE VIAL GLASS SM (MISCELLANEOUS) IMPLANT
DRAPE EXTREMITY T 121X128X90 (DISPOSABLE) ×3 IMPLANT
DRAPE IMP U-DRAPE 54X76 (DRAPES) ×3 IMPLANT
DRAPE OEC MINIVIEW 54X84 (DRAPES) ×3 IMPLANT
DRAPE SURG 17X23 STRL (DRAPES) ×2 IMPLANT
DRAPE U-SHAPE 47X51 STRL (DRAPES) ×1 IMPLANT
ELECT BLADE 4.0 EZ CLEAN MEGAD (MISCELLANEOUS) ×2
ELECT REM PT RETURN 9FT ADLT (ELECTROSURGICAL) ×4
ELECTRODE BLDE 4.0 EZ CLN MEGD (MISCELLANEOUS) ×1 IMPLANT
ELECTRODE REM PT RTRN 9FT ADLT (ELECTROSURGICAL) ×1 IMPLANT
GAUZE SPONGE 4X4 12PLY STRL (GAUZE/BANDAGES/DRESSINGS) ×2 IMPLANT
GLOVE BIO SURGEON STRL SZ7 (GLOVE) ×3 IMPLANT
GLOVE BIO SURGEON STRL SZ7.5 (GLOVE) ×3 IMPLANT
GLOVE BIOGEL PI IND STRL 7.0 (GLOVE) ×1 IMPLANT
GLOVE BIOGEL PI IND STRL 8 (GLOVE) ×1 IMPLANT
GLOVE BIOGEL PI INDICATOR 7.0 (GLOVE) ×1
GLOVE BIOGEL PI INDICATOR 8 (GLOVE) ×2
GLOVE INDICATOR 7.0 STRL GRN (GLOVE) ×1 IMPLANT
GOWN STRL REUS W/ TWL LRG LVL3 (GOWN DISPOSABLE) ×2 IMPLANT
GOWN STRL REUS W/ TWL XL LVL3 (GOWN DISPOSABLE) ×1 IMPLANT
GOWN STRL REUS W/TWL LRG LVL3 (GOWN DISPOSABLE) ×4
GOWN STRL REUS W/TWL XL LVL3 (GOWN DISPOSABLE) ×2
INSERTER BUTTON (SYSTAGENIX WOUND MANAGEMENT) ×1 IMPLANT
NDL HYPO 25X1 1.5 SAFETY (NEEDLE) IMPLANT
NEEDLE HYPO 25X1 1.5 SAFETY (NEEDLE) IMPLANT
NS IRRIG 1000ML POUR BTL (IV SOLUTION) ×3 IMPLANT
PACK BASIN DAY SURGERY FS (CUSTOM PROCEDURE TRAY) ×3 IMPLANT
PAD CAST 4YDX4 CTTN HI CHSV (CAST SUPPLIES) ×2 IMPLANT
PADDING CAST ABS 4INX4YD NS (CAST SUPPLIES) ×1
PADDING CAST ABS COTTON 4X4 ST (CAST SUPPLIES) IMPLANT
PADDING CAST COTTON 4X4 STRL (CAST SUPPLIES) ×5
PENCIL BUTTON HOLSTER BLD 10FT (ELECTRODE) ×3 IMPLANT
PIN DRILL ACL TIGHTROPE 4MM (PIN) ×1 IMPLANT
SLEEVE SCD COMPRESS KNEE MED (MISCELLANEOUS) ×2 IMPLANT
SLING ARM FOAM STRAP LRG (SOFTGOODS) ×1 IMPLANT
SLING ARM FOAM STRAP XLG (SOFTGOODS) ×1 IMPLANT
SLING ARM XL FOAM STRAP (SOFTGOODS) IMPLANT
SPLINT FAST PLASTER 5X30 (CAST SUPPLIES) ×1
SPLINT PLASTER CAST FAST 5X30 (CAST SUPPLIES) IMPLANT
SPLINT PLASTER CAST XFAST 4X15 (CAST SUPPLIES) IMPLANT
SPLINT PLASTER XTRA FAST SET 4 (CAST SUPPLIES)
SPONGE LAP 4X18 RFD (DISPOSABLE) ×2 IMPLANT
STOCKINETTE 4X48 STRL (DRAPES) ×2 IMPLANT
STOCKINETTE IMPERVIOUS LG (DRAPES) ×1 IMPLANT
STRIP CLOSURE SKIN 1/2X4 (GAUZE/BANDAGES/DRESSINGS) ×3 IMPLANT
SUCTION FRAZIER HANDLE 10FR (MISCELLANEOUS) ×2
SUCTION TUBE FRAZIER 10FR DISP (MISCELLANEOUS) ×1 IMPLANT
SUT FIBERWIRE #2 38 T-5 BLUE (SUTURE) ×2
SUT MNCRL AB 4-0 PS2 18 (SUTURE) IMPLANT
SUT SILK 3 0 TIES 17X18 (SUTURE)
SUT SILK 3-0 18XBRD TIE BLK (SUTURE) IMPLANT
SUT VIC AB 0 CT1 27 (SUTURE)
SUT VIC AB 0 CT1 27XBRD ANBCTR (SUTURE) IMPLANT
SUT VIC AB 2-0 SH 27 (SUTURE)
SUT VIC AB 2-0 SH 27XBRD (SUTURE) IMPLANT
SUT VIC AB 3-0 SH 27 (SUTURE) ×2
SUT VIC AB 3-0 SH 27X BRD (SUTURE) ×1 IMPLANT
SUT VICRYL AB 3 0 TIES (SUTURE) IMPLANT
SUTURE FIBERWR #2 38 T-5 BLUE (SUTURE) ×1 IMPLANT
SUTURE TAPE 1.3 40 TPR END (SUTURE) IMPLANT
SUTURE TAPE 1.3 FIBERLOP 20 ST (SUTURE) IMPLANT
SUTURETAPE 1.3 40 TPR END (SUTURE) ×4
SUTURETAPE 1.3 FIBERLOOP 20 ST (SUTURE)
SYR BULB 3OZ (MISCELLANEOUS) ×3 IMPLANT
SYR CONTROL 10ML LL (SYRINGE) IMPLANT
TOWEL OR 17X26 10 PK STRL BLUE (TOWEL DISPOSABLE) ×1 IMPLANT
TUBE CONNECTING 12X1/4 (SUCTIONS) ×3 IMPLANT
UNDERPAD 30X30 (UNDERPADS AND DIAPERS) ×2 IMPLANT
YANKAUER SUCT BULB TIP NO VENT (SUCTIONS) ×3 IMPLANT

## 2019-02-03 NOTE — Anesthesia Procedure Notes (Signed)
Procedure Name: LMA Insertion Date/Time: 02/03/2019 9:39 AM Performed by: Genelle Bal, CRNA Pre-anesthesia Checklist: Patient identified, Emergency Drugs available, Suction available and Patient being monitored Patient Re-evaluated:Patient Re-evaluated prior to induction Oxygen Delivery Method: Circle system utilized Preoxygenation: Pre-oxygenation with 100% oxygen Induction Type: IV induction Ventilation: Mask ventilation without difficulty LMA: LMA inserted LMA Size: 4.0 Number of attempts: 1 Airway Equipment and Method: Bite block Placement Confirmation: positive ETCO2 Tube secured with: Tape Dental Injury: Teeth and Oropharynx as per pre-operative assessment

## 2019-02-03 NOTE — Transfer of Care (Signed)
Immediate Anesthesia Transfer of Care Note  Patient: Tyler Galvan  Procedure(s) Performed: DISTAL BICEPS TENDON REPAIR (Left )  Patient Location: PACU  Anesthesia Type:GA combined with regional for post-op pain  Level of Consciousness: awake, alert  and oriented  Airway & Oxygen Therapy: Patient Spontanous Breathing and Patient connected to face mask oxygen  Post-op Assessment: Report given to RN and Post -op Vital signs reviewed and stable  Post vital signs: Reviewed and stable  Last Vitals:  Vitals Value Taken Time  BP 128/82   Temp    Pulse 85 02/03/19 1103  Resp 14   SpO2 97 % 02/03/19 1103  Vitals shown include unvalidated device data.  Last Pain:  Vitals:   02/03/19 0811  TempSrc:   PainSc: 3       Patients Stated Pain Goal: 5 (02/58/52 7782)  Complications: No apparent anesthesia complications

## 2019-02-03 NOTE — Anesthesia Preprocedure Evaluation (Addendum)
Anesthesia Evaluation  Patient identified by MRN, date of birth, ID band Patient awake    Reviewed: Allergy & Precautions, NPO status , Patient's Chart, lab work & pertinent test results  History of Anesthesia Complications Negative for: history of anesthetic complications  Airway Mallampati: II  TM Distance: >3 FB Neck ROM: Full    Dental  (+) Dental Advisory Given, Teeth Intact   Pulmonary sleep apnea and Continuous Positive Airway Pressure Ventilation ,   Exercise-induced asthma as a child    breath sounds clear to auscultation       Cardiovascular hypertension, Pt. on medications  Rhythm:Regular Rate:Normal     Neuro/Psych  Headaches, negative psych ROS   GI/Hepatic Neg liver ROS, hiatal hernia,   Endo/Other  negative endocrine ROS  Renal/GU negative Renal ROS    BPH     Musculoskeletal  (+) Arthritis , Osteoarthritis,    Abdominal   Peds  Hematology negative hematology ROS (+)   Anesthesia Other Findings   Reproductive/Obstetrics                            Anesthesia Physical Anesthesia Plan  ASA: III  Anesthesia Plan: General   Post-op Pain Management:  Regional for Post-op pain   Induction: Intravenous  PONV Risk Score and Plan: 1 and Treatment may vary due to age or medical condition, Ondansetron, Dexamethasone and Midazolam  Airway Management Planned: LMA  Additional Equipment: None  Intra-op Plan:   Post-operative Plan: Extubation in OR  Informed Consent: I have reviewed the patients History and Physical, chart, labs and discussed the procedure including the risks, benefits and alternatives for the proposed anesthesia with the patient or authorized representative who has indicated his/her understanding and acceptance.     Dental advisory given  Plan Discussed with: CRNA and Anesthesiologist  Anesthesia Plan Comments:       Anesthesia Quick  Evaluation

## 2019-02-03 NOTE — Discharge Instructions (Signed)
Discharge Instructions after Open Elbow Surgery   A sling may be provided for you. If so, remain in your sling at all times. This includes sleeping in the sling.  Use ice on the elbow intermittently over the first 48 hours after surgery. Pain medicine has been prescribed for you.  Use your medicine liberally over the first 48 hours, and then you can begin to taper your use. You may take Extra Strength Tylenol or Tylenol only in place of the pain pills.  Leave the dressing in place until your follow-up appointment  You may shower after surgery. The dressing CANNOT get wet during your shower. Wrap the dressing with a dry towel and place your arm in a large trash bag.  Take one aspirin a day for 2 weeks after surgery, unless you have an aspirin sensitivity/ allergy or asthma.  You may receive Indocin (Indomethacin) to take after surgery. If so, take as per the prescription. You MUST take this medication on a full stomach. If you begin to experience stomach pain, discontinue the Indocin. Otherwise, continue to take it regardless of how your elbow feels. While taking Indocin DO NOT take ANY nonsteroidal anti-inflammatory pain medications: Advil, Motrin, Ibuprofen, Aleve, Naproxen, or Narprosyn.   Please call [phone] during normal business hours or [phone] after hours for any problems. Including the following:  - excessive redness of the incisions - drainage for more than 4 days - fever of more than 101.5 F  *Please note that pain medications will not be refilled after hours or on weekends.    Post Anesthesia Home Care Instructions  Activity: Get plenty of rest for the remainder of the day. A responsible individual must stay with you for 24 hours following the procedure.  For the next 24 hours, DO NOT: -Drive a car -Paediatric nurse -Drink alcoholic beverages -Take any medication unless instructed by your physician -Make any legal decisions or sign important  papers.  Meals: Start with liquid foods such as gelatin or soup. Progress to regular foods as tolerated. Avoid greasy, spicy, heavy foods. If nausea and/or vomiting occur, drink only clear liquids until the nausea and/or vomiting subsides. Call your physician if vomiting continues.  Special Instructions/Symptoms: Your throat may feel dry or sore from the anesthesia or the breathing tube placed in your throat during surgery. If this causes discomfort, gargle with warm salt water. The discomfort should disappear within 24 hours.  If you had a scopolamine patch placed behind your ear for the management of post- operative nausea and/or vomiting:  1. The medication in the patch is effective for 72 hours, after which it should be removed.  Wrap patch in a tissue and discard in the trash. Wash hands thoroughly with soap and water. 2. You may remove the patch earlier than 72 hours if you experience unpleasant side effects which may include dry mouth, dizziness or visual disturbances. 3. Avoid touching the patch. Wash your hands with soap and water after contact with the patch.

## 2019-02-03 NOTE — CV Procedure (Signed)
Procedure(s): DISTAL BICEPS TENDON REPAIR Procedure Note  Tyler Galvan male 58 y.o. 02/03/2019  Procedure(s) and Anesthesia Type:    LEFT DISTAL BICEPS TENDON REPAIR - Choice      Surgeon: Berline LopesJustin W Grae Cannata   Assistants: Damita Lackanielle Lalibert PA-C (Danielle was present and scrubbed throughout the procedure and was essential in positioning, retraction, exposure, and closure)  Anesthesia: General LMA anesthesia with preoperative regional block given by the attending anesthesiologist    Procedure Detail  DISTAL BICEPS TENDON REPAIR  Estimated Blood Loss:  less than 50 mL         Drains: none  Blood Given: none         Specimens: none        Complications:  * No complications entered in OR log *         Disposition: PACU - hemodynamically stable.         Condition: stable    Procedure:   INDICATIONS FOR SURGERY: The patient suffered a distal biceps rupture. He was indicated for surgical treatment to restore flexion and supination of strength and endurance. He understood risks benefits alternatives to the procedure including but not limited to the risk of nonhealing, or stiffness, damage to neurovascular structures, heterotopic bone formation, and the potential need for future surgery.  OPERATIVE FINDINGS: Complete rupture of the distal biceps tendon. Repaired back through bone tunnels using a Arthrex button device.   DESCRIPTION OF PROCEDURE: The patient was identified in preoperative  holding area where I personally marked the operative site after  verifying site, side, and procedure with the patient. The patient was taken back  to the operating room where general anesthesia was induced without  Complication. The patient did receive preoperative IV antibiotics. The patient was kept in the supine position. The operative extremity was prepped and draped in standard sterile fashion. A. approximately 4 cm transverse incision was made about a centimeter distal to the common  of flexion crease of the elbow. Dissection was carried down through subcutaneous tissues beneath the fascia. Proximally the distal biceps stump was identified. Any adhesions around the body of the biceps muscle belly were digitally lysed. The distal end of the biceps tendon was freshened sharply and scar tissue was removed. The Arthrex metallic button device was then fixed to the distal end of the tendon using a suture tape in a running locking Krakw configuration .  The distal tunnel going down to the bare radial tuberosity was identified and developed. Retractors were placed distally taking great care to stay directly on bone. Once the bicipital tuberosity was identified the guidepin was placed unicortically fluoroscopic imaging was used to verify its position on the bicipital tuberosity. The guidepin was then advanced bicortically and the 7 mm reamer was used on the volar cortex and then the dorsal cortex was reamed with the 4.5 mm reamer.  Then the button was flipped on the opposite side of the bone and its position was verified with fluoroscopic imaging. The wound is copiously irrigated of any bone dust at this point. The sutures were then alternatively tightening to bringing the prepared tendon nicely down into the bone tunnel anchoring securely.  A free needle was then used to suture to the distal end of the tendon securing it in place.  Final fluoroscopic imaging demonstrated appropriate positioning of the button and tunnels. The wound was again copiously irrigated with normal saline. The tension of the repair was tested and noted to be a bit tight beyond about 45  degrees flexion. The wound is then closed with 3-0 Vicryl in a deep dermal layer and Steri-Strips for skin closure. Steri-Strips were applied. A long-arm splint was applied to 90 of flexion with neutral wrist rotation. Patient was allowed to awaken from general anesthesia transferred to stretcher and taken to the recovery room in stable  condition.   POSTOPERATIVE PLAN: The patient will be discharged home today with family.  He will followup in about one week at which point we will get him out of his splint and begin some gentle range of motion exercises. He will take Indocin SR 75 mg daily for the next 3 weeks for heterotopic bone prophylaxis.

## 2019-02-03 NOTE — Progress Notes (Signed)
AssistedDr. Brock with left, ultrasound guided, interscalene  block. Side rails up, monitors on throughout procedure. See vital signs in flow sheet. Tolerated Procedure well.  

## 2019-02-03 NOTE — H&P (Signed)
Tyler Galvan is an 58 y.o. male.   Chief Complaint: L elbow injury   HPI: L distal biceps rupture, indicated for surgical treatment to restore strength and endurance.  Past Medical History:  Diagnosis Date  . Biceps rupture, distal    left  . BPH without urinary obstruction   . Chronic seasonal allergic rhinitis    pulmonologist-- [facility] in [place]-- dr Ether Griffinsfowler (notes in epic)  . Diverticulosis   . Hiatal hernia   . History of asthma    childhood to teen exercised induced  . History of diverticulitis of colon   . 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 HLD (hyperlipidemia) 05/10/2016  . Hypertension   . Migraines    neurologist-- dr Vincente Polibedner [facility] [place], [place]  . OA (osteoarthritis)    fingers  . OSA on CPAP   . 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 Wears glasses     Past Surgical History:  Procedure Laterality Date  . COLONOSCOPY  last one 2015  . LAPAROSCOPIC CHOLECYSTECTOMY  1998  . 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 TONSILLECTOMY AND ADENOIDECTOMY  1968  . UMBILICAL HERNIA REPAIR  2015 approx.    Family History  Problem Relation Age of Onset  . Mental illness Mother        died of trauma  . Cancer Father        kidney  . Thalassemia Father   . Thalassemia Brother   . Mental illness Son   . Mental illness Brother        opiod overdose  . Cancer Paternal Grandfather        stomach  . Alcohol abuse Paternal Grandfather    Social History:  reports that he has never smoked. He quit smokeless tobacco use about 40 years ago.  His smokeless tobacco use included snuff. He reports that he does not drink alcohol or use drugs.  Allergies:  Allergies  Allergen Reactions  . Amlodipine Swelling  . Nifedipine Swelling and Other (See Comments)    Daily migraines     Medications  Prior to Admission  Medication Sig Dispense Refill  . butorphanol (STADOL) 10 MG/ML nasal spray Place 1 spray into the nose as needed.   0  . fluticasone (FLONASE) 50 MCG/ACT nasal spray Place 1 spray into both nostrils every evening.     Boris Lown. Krill Oil 1000 MG CAPS Take by mouth daily.    Marland Kitchen. lisinopril (PRINIVIL,ZESTRIL) 10 MG tablet TAKE 1 TABLET(10 MG) BY MOUTH DAILY (Patient taking differently: Take 10 mg by mouth daily. TAKE 1 TABLET(10 MG) BY MOUTH DAILY) 90 tablet 0  . Multiple Vitamin (MULTIVITAMIN) tablet Take 1 tablet by mouth daily.    . SUMAtriptan (IMITREX) 6 MG/0.5ML SOLN injection   0  . tamsulosin (FLOMAX) 0.4 MG CAPS capsule TAKE 1 CAPSULE(0.4 MG) BY MOUTH DAILY (Patient taking differently: Take 0.4 mg by mouth. TAKE 1 CAPSULE(0.4 MG) BY MOUTH DAILY) 90 capsule 0  . traMADol (ULTRAM) 50 MG tablet Take 50 mg by mouth every 6 (six) hours as needed.    . Wheat Dextrin (BENEFIBER) POWD Take by mouth daily.    Marland Kitchen. aspirin EC 81 MG tablet Take 81 mg by mouth daily.    . Insulin Syringe-Needle U-100 (  INSULIN SYRINGE .5CC/31GX5/16") 31G X 5/16" 0.5 ML MISC TO BE USED WITH SUMATRIPTAN INJECTION  8    Results for orders placed or performed during the hospital encounter of 02/03/19 (from the past 48 hour(s))  I-STAT 4, (NA,K, GLUC, HGB,HCT)     Status: None   Collection Time: 02/03/19  8:14 AM  Result Value Ref Range   Sodium 139 135 - 145 mmol/L   Potassium 3.6 3.5 - 5.1 mmol/L   Glucose, Bld 98 70 - 99 mg/dL   HCT 39.0 39.0 - 52.0 %   Hemoglobin 13.3 13.0 - 17.0 g/dL   No results found.  Review of Systems  All other systems reviewed and are negative.   Blood pressure 136/87, pulse (!) 49, temperature 98.2 F (36.8 C), temperature source Oral, resp. rate 12, height 6\' 4"  (1.93 m), weight 109.9 kg, SpO2 100 %. Physical Exam  Constitutional: He is oriented to person, place, and time. He appears well-developed and well-nourished.  HENT:  Head: Atraumatic.  Eyes: EOM are normal.   Cardiovascular: Intact distal pulses.  Respiratory: Effort normal.  Musculoskeletal:     Comments: L elbow with pos hook test, ecchymosis. NVID  Neurological: He is alert and oriented to person, place, and time.  Skin: Skin is warm and dry.  Psychiatric: He has a normal mood and affect.     Assessment/Plan L distal biceps rupture, indicated for surgical treatment to restore strength and endurance. Risks / benefits of surgery discussed Consent on chart  NPO for OR Preop antibiotics   Isabella Stalling, MD 02/03/2019, 9:08 AM

## 2019-02-03 NOTE — Anesthesia Procedure Notes (Signed)
Anesthesia Regional Block: Interscalene brachial plexus block   Pre-Anesthetic Checklist: ,, timeout performed, Correct Patient, Correct Site, Correct Laterality, Correct Procedure, Correct Position, site marked, Risks and benefits discussed,  Surgical consent,  Pre-op evaluation,  At surgeon's request and post-op pain management  Laterality: Left  Prep: chloraprep       Needles:  Injection technique: Single-shot  Needle Type: Echogenic Needle     Needle Length: 5cm  Needle Gauge: 21     Additional Needles:   Narrative:  Start time: 02/03/2019 8:57 AM End time: 02/03/2019 9:00 AM Injection made incrementally with aspirations every 5 mL.  Performed by: Personally  Anesthesiologist: Audry Pili, MD  Additional Notes: No pain on injection. No increased resistance to injection. Injection made in 5cc increments. Good needle visualization. Patient tolerated the procedure well.

## 2019-02-03 NOTE — Anesthesia Postprocedure Evaluation (Signed)
Anesthesia Post Note  Patient: Tyler Galvan  Procedure(s) Performed: DISTAL BICEPS TENDON REPAIR (Left )     Patient location during evaluation: PACU Anesthesia Type: General Level of consciousness: awake and alert Pain management: pain level controlled Vital Signs Assessment: post-procedure vital signs reviewed and stable Respiratory status: spontaneous breathing, nonlabored ventilation and respiratory function stable Cardiovascular status: blood pressure returned to baseline and stable Postop Assessment: no apparent nausea or vomiting Anesthetic complications: no    Last Vitals:  Vitals:   02/03/19 1101 02/03/19 1115  BP: 128/82 117/86  Pulse: 90 81  Resp: 16 20  Temp: 36.8 C   SpO2: 96% 96%    Last Pain:  Vitals:   02/03/19 1130  TempSrc:   PainSc: [place]

## 2019-02-04 ENCOUNTER — Encounter (HOSPITAL_BASED_OUTPATIENT_CLINIC_OR_DEPARTMENT_OTHER): Payer: Self-pay | Admitting: Orthopedic Surgery

## 2019-04-14 ENCOUNTER — Encounter (HOSPITAL_COMMUNITY): Payer: Self-pay

## 2019-04-14 ENCOUNTER — Emergency Department (HOSPITAL_COMMUNITY): Payer: 59

## 2019-04-14 ENCOUNTER — Emergency Department (HOSPITAL_COMMUNITY)
Admission: EM | Admit: 2019-04-14 | Discharge: 2019-04-15 | Disposition: A | Discharge status: Home / Self Care | Payer: 59 | Attending: Emergency Medicine | Admitting: Emergency Medicine

## 2019-04-14 ENCOUNTER — Other Ambulatory Visit: Payer: Self-pay

## 2019-04-14 DIAGNOSIS — Z79899 Other long term (current) drug therapy: Secondary | ICD-10-CM | POA: Insufficient documentation

## 2019-04-14 DIAGNOSIS — N200 Calculus of kidney: Secondary | ICD-10-CM | POA: Diagnosis not present

## 2019-04-14 DIAGNOSIS — R1031 Right lower quadrant pain: Secondary | ICD-10-CM | POA: Diagnosis present

## 2019-04-14 DIAGNOSIS — I10 Essential (primary) hypertension: Secondary | ICD-10-CM | POA: Insufficient documentation

## 2019-04-14 DIAGNOSIS — Z7982 Long term (current) use of aspirin: Secondary | ICD-10-CM | POA: Insufficient documentation

## 2019-04-14 DIAGNOSIS — E876 Hypokalemia: Secondary | ICD-10-CM | POA: Insufficient documentation

## 2019-04-14 DIAGNOSIS — N23 Unspecified renal colic: Secondary | ICD-10-CM

## 2019-04-14 DIAGNOSIS — J45909 Unspecified asthma, uncomplicated: Secondary | ICD-10-CM | POA: Diagnosis not present

## 2019-04-14 DIAGNOSIS — R17 Unspecified jaundice: Secondary | ICD-10-CM | POA: Insufficient documentation

## 2019-04-14 LAB — CBC WITH DIFFERENTIAL/PLATELET
Abs Immature Granulocytes: 0.02 10*3/uL (ref 0.00–0.07)
Basophils Absolute: 0 10*3/uL (ref 0.0–0.1)
Basophils Relative: 0 %
Eosinophils Absolute: 0.1 10*3/uL (ref 0.0–0.5)
Eosinophils Relative: 1 %
HCT: 39.5 % (ref 39.0–52.0)
Hemoglobin: 13.4 g/dL (ref 13.0–17.0)
Immature Granulocytes: 0 %
Lymphocytes Relative: 14 %
Lymphs Abs: 1 10*3/uL (ref 0.7–4.0)
MCH: 29.8 pg (ref 26.0–34.0)
MCHC: 33.9 g/dL (ref 30.0–36.0)
MCV: 87.8 fL (ref 80.0–100.0)
Monocytes Absolute: 0.5 10*3/uL (ref 0.1–1.0)
Monocytes Relative: 8 %
Neutro Abs: 5.5 10*3/uL (ref 1.7–7.7)
Neutrophils Relative %: 77 %
Platelets: 218 10*3/uL (ref 150–400)
RBC: 4.5 MIL/uL (ref 4.22–5.81)
RDW: 12.9 % (ref 11.5–15.5)
WBC: 7.1 10*3/uL (ref 4.0–10.5)
nRBC: 0 % (ref 0.0–0.2)

## 2019-04-14 MED ORDER — MORPHINE SULFATE (PF) 4 MG/ML IV SOLN
4.0000 mg | Freq: Once | INTRAVENOUS | Status: AC
  Administered 2019-04-14: 4 mg via INTRAVENOUS
  Filled 2019-04-14: qty 1

## 2019-04-14 MED ORDER — SODIUM CHLORIDE 0.9 % IV BOLUS
1000.0000 mL | Freq: Once | INTRAVENOUS | Status: AC
  Administered 2019-04-14: 23:00:00 1000 mL via INTRAVENOUS

## 2019-04-14 NOTE — ED Provider Notes (Addendum)
[facility]-EMERGENCY DEPT Provider Note   CSN: [id number] Arrival date & time: 04/14/19  2236    History   Chief Complaint Chief Complaint  Patient presents with   Abdominal Pain    HPI Tyler Galvan is a 58 y.o. male.   The history is provided by the patient.  Abdominal Pain He has history of hyperlipidemia, hypertension, diverticulosis and comes in because of abdominal pain.  He had sudden onset of abdominal pain at about 10PM.  Pain is located in the right lower abdomen with some radiation to the back and into the groin.  This was associated with a sense of urinary urgency but he was only able to urinate a few drops.  He then had nausea and vomiting.  He did break out in a sweat.  He rated pain at 9/10.  He called for an ambulance who gave him ondansetron and fentanyl, and pain is down to 2/10.  He has never had pain like this before.  Nothing made it better, nothing made it worse.  Of note, he is status post cholecystectomy.  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    per pt has always had positive skin test since 1980s, but CXR's have always been normal   HLD (hyperlipidemia) 05/10/2016   Hypertension    Migraines    neurologist-- dr Vincente Poli [facility] in [place], [place])   OA (osteoarthritis)    fingers   OSA on CPAP    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    per pt "anything cold to my skin it causes hives and swelling around area"   Wears glasses     Patient Active Problem List   Diagnosis Date Noted   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   HLD (hyperlipidemia) 05/10/2016   Diverticulitis of  colon 04/17/2016   Essential hypertension 04/11/2016   Migraine 04/11/2016   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        Home Medications    Prior to Admission medications   Medication Sig Start Date End Date Taking? Authorizing Provider  aspirin EC 81 MG tablet Take 81 mg by mouth daily.    [provider]  butorphanol (STADOL) 10 MG/ML nasal spray Place 1 spray into the nose as needed.  03/13/16   [provider]  fluticasone (FLONASE) 50 MCG/ACT nasal spray Place 1 spray into both nostrils every evening.     [provider]  indomethacin (INDOCIN SR) 75 MG CR capsule Take 1  capsule (75 mg total) by mouth daily. 02/03/19   Grier Mitts, PA-C  Insulin Syringe-Needle U-100 (INSULIN SYRINGE .5CC/31GX5/16") 31G X 5/16" 0.5 ML MISC TO BE USED WITH SUMATRIPTAN INJECTION 07/02/16   [provider]  Javier Docker Oil 1000 MG CAPS Take by mouth daily.    [provider]  lisinopril (PRINIVIL,ZESTRIL) 10 MG tablet TAKE 1 TABLET(10 MG) BY MOUTH DAILY Patient taking differently: Take 10 mg by mouth daily. TAKE 1 TABLET(10 MG) BY MOUTH DAILY 11/20/17   Raylene Everts, MD  Multiple Vitamin (MULTIVITAMIN) tablet Take 1 tablet by mouth daily.    [provider]  oxyCODONE-acetaminophen (PERCOCET) 5-325 MG tablet Take 1-2 tablets by mouth every 4 (four) hours as  needed for severe pain. 02/03/19   Grier Mitts, PA-C  SUMAtriptan (IMITREX) 6 MG/0.5ML SOLN injection  03/13/16   [provider]  tamsulosin (FLOMAX) 0.4 MG CAPS capsule TAKE 1 CAPSULE(0.4 MG) BY MOUTH DAILY Patient taking differently: Take 0.4 mg by mouth. TAKE 1 CAPSULE(0.4 MG) BY MOUTH DAILY 11/20/17   Raylene Everts, MD  tiZANidine (ZANAFLEX) 4 MG tablet Take 1 tablet by mouth every 8 hours prn spasm 02/03/19   Grier Mitts, PA-C  traMADol (ULTRAM) 50 MG tablet Take 50 mg by mouth every 6 (six) hours as needed.    [provider]  Wheat Dextrin (BENEFIBER) POWD Take by mouth daily.    [provider]    Family History Family History  Problem Relation Age of Onset   Mental illness Mother        died of trauma   Cancer Father        kidney   Thalassemia Father    Thalassemia Brother    Mental illness Son    Mental illness Brother        opiod overdose   Cancer Paternal Grandfather        stomach   Alcohol abuse Paternal Grandfather     Social History Social History   Tobacco Use   Smoking status: Never Smoker   Smokeless tobacco: Former Systems developer    Types: Snuff  Substance Use Topics   Alcohol use: No   Drug use: Never     Allergies   Amlodipine and Nifedipine   Review of Systems Review of Systems  Gastrointestinal: Positive for abdominal pain.  All other systems reviewed and are negative.    Physical Exam Updated Vital Signs BP (!) 164/96 (BP Location: Left Arm)    Pulse 85    Temp (!) 97.5 F (36.4 C) (Oral)    Resp 18    SpO2 100%   Physical Exam Vitals signs and nursing note reviewed.    58 year old male, resting comfortably and in no acute distress. Vital signs are significant for elevated blood pressure. Oxygen saturation is 100%, which is normal. Head is normocephalic and atraumatic. PERRLA, EOMI. Oropharynx is clear. Neck is nontender and supple without adenopathy or JVD. Back is nontender in the  midline.  There is mild right CVA tenderness. Lungs are clear without rales, wheezes, or rhonchi. Chest is nontender. Heart has regular rate and rhythm without murmur. Abdomen is soft, flat, with moderate right lower quadrant tenderness.  There is no rebound or guarding.  There are no masses or hepatosplenomegaly and peristalsis is hypoactive. Extremities have no cyanosis or edema, full range of motion is present. Skin is warm and dry without rash. Neurologic: Mental status is normal, cranial nerves are intact, there are no motor or  sensory deficits.  ED Treatments / Results  Labs (all labs ordered are listed, but only abnormal results are displayed) Labs Reviewed  COMPREHENSIVE METABOLIC PANEL - Abnormal; Notable for the following components:      Result Value   Potassium 3.4 (*)    CO2 21 (*)    Glucose, Bld 111 (*)    Calcium 8.8 (*)    Total Bilirubin 1.3 (*)    All other components within normal limits  URINALYSIS, ROUTINE W REFLEX MICROSCOPIC - Abnormal; Notable for the following components:   Hgb urine dipstick LARGE (*)    RBC / HPF >50 (*)    All other components within normal limits  LIPASE, BLOOD  CBC WITH DIFFERENTIAL/PLATELET   Radiology Ct Renal Stone Study  Result Date: 04/15/2019 CLINICAL DATA:  Sudden onset of right flank pain. Vomiting. EXAM: CT ABDOMEN AND PELVIS WITHOUT CONTRAST TECHNIQUE: Multidetector CT imaging of the abdomen and pelvis was performed following the standard protocol without IV contrast. COMPARISON:  CT 03/03/2018 FINDINGS: Lower chest: Linear atelectasis in the right lower lobe. Lung bases otherwise clear. Hepatobiliary: No focal liver abnormality is seen. Focal fatty infiltration adjacent to the falciform ligament. Status post cholecystectomy. No biliary dilatation. Pancreas: No ductal dilatation or inflammation. Spleen: Normal in size without focal abnormality. Splenule inferiorly. Adrenals/Urinary Tract: Normal adrenal glands. Mild right  perinephric edema but no hydronephrosis. There are nonobstructing stones in the right kidney, largest in the lower pole measuring 5 mm. No ureteral calculi are visualized. There is a possible tiny punctate stone in the dependent bladder, series 6, image 98. Cystic changes at the left renal pelvis represent parapelvic cysts as seen on prior contrast-enhanced exam. Punctate nonobstructing stone in the lower left kidney. Mild left perinephric edema. The left ureter is decompressed. Possible but not definite tiny stone in the dependent bladder. No bladder wall thickening. Stomach/Bowel: Stomach is within normal limits. Appendix appears normal. No evidence of bowel wall thickening, distention, or inflammatory changes. Multifocal colonic diverticulosis without acute diverticulitis. Vascular/Lymphatic: Mild aortic atherosclerosis. No aneurysm. No enlarged lymph nodes in the abdomen or pelvis. Reproductive: Prostate is unremarkable. Other: No free air, free fluid, or intra-abdominal fluid collection. Musculoskeletal: There are no acute or suspicious osseous abnormalities. Degenerative disc disease at L4-L5 and L5-S1. IMPRESSION: 1. No hydronephrosis of either kidney. Cystic changes at the left renal hilum represents parapelvic cysts as seen on prior contrast-enhanced exam. There is bilateral perinephric edema appears similar to prior exam. Possible but not definite punctate stone in the dependent bladder. 2. Bilateral nonobstructing nephrolithiasis. 3. Colonic diverticulosis without acute inflammation. Aortic Atherosclerosis (ICD10-I70.0). Electronically Signed   By: Narda RutherfordMelanie  Sanford M.D.   On: 04/15/2019 00:22    Procedures Procedures   Medications Ordered in ED Medications  morphine 4 MG/ML injection 4 mg (4 mg Intravenous Given 04/14/19 2322)  sodium chloride 0.9 % bolus 1,000 mL (0 mLs Intravenous Stopped 04/15/19 0035)  ketorolac (TORADOL) 30 MG/ML injection 30 mg (30 mg Intravenous Given 04/15/19 0035)      Initial Impression / Assessment and Plan / ED Course  I have reviewed the triage vital signs and the nursing notes.  Pertinent labs & imaging results that were available during my care of the patient were reviewed by me and considered in my medical decision making (see chart for details).  Right lower quadrant pain most likely ureteral colic.  Old records are reviewed, and he had a CT of abdomen and pelvis done March 03, 2018, and did demonstrate bilateral  nonobstructing renal calculi.  He also had diverticulosis and does endorse a history of diverticulitis, but states that today's pain feels different.  Other possibilities include urinary tract infection, internal hernia, bowel obstruction.  Although he is feeling better following fentanyl, I do expect it to wear off and he is given a dose of morphine.  He will be given IV fluids and sent for renal stone protocol CT scan.  Labs show borderline hypokalemia, probably related to prior episode of emesis.  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  Urinalysis is significant for microscopic hematuria.  CT scan shows probable punctate calculus in the bladder, no current hydronephrosis, residual bilateral nephrolithiasis.  Patient still has mild pain, but is felt to be safe for discharge.  He is given a dose of ketorolac and is instructed to drink plenty of fluids and take over-the-counter NSAIDs and acetaminophen as needed for pain.  Return precautions discussed.  Final Clinical Impressions(s) / ED Diagnoses   Final diagnoses:  Renal colic on right side  Nephrolithiasis  Serum total bilirubin elevated  Hypokalemia    ED Discharge Orders    None       Dione BoozeGlick, Corissa Oguinn, MD 04/15/19 [id number]    Dione BoozeGlick, Tahji Worden, MD 04/15/19 [phone]

## 2019-04-14 NOTE — ED Triage Notes (Signed)
BIB EMS from home. Sudden onset RLQ abd pain that started when eating 1 HR ago. Nausea, 1 episode of vomiting  Hx of diverticulitis however pain is not the same. gallbladder removed.    18g R Hand  4mg  Zofran 125mcg fentanyl

## 2019-04-15 LAB — URINALYSIS, ROUTINE W REFLEX MICROSCOPIC
Bacteria, UA: NONE SEEN
Bilirubin Urine: NEGATIVE
Glucose, UA: NEGATIVE mg/dL
Ketones, ur: NEGATIVE mg/dL
Leukocytes,Ua: NEGATIVE
Nitrite: NEGATIVE
Protein, ur: NEGATIVE mg/dL
RBC / HPF: >50 RBC/hpf — ABNORMAL HIGH (ref 0–5)
Specific Gravity, Urine: 1.014 (ref 1.005–1.030)
pH: 8 (ref 5.0–8.0)

## 2019-04-15 LAB — COMPREHENSIVE METABOLIC PANEL
ALT: 27 U/L (ref 0–44)
AST: 25 U/L (ref 15–41)
Albumin: 4 g/dL (ref 3.5–5.0)
Alkaline Phosphatase: 42 U/L (ref 38–126)
Anion gap: 9 (ref 5–15)
BUN: 20 mg/dL (ref 6–20)
CO2: 21 mmol/L — ABNORMAL LOW (ref 22–32)
Calcium: 8.8 mg/dL — ABNORMAL LOW (ref 8.9–10.3)
Chloride: 109 mmol/L (ref 98–111)
Creatinine, Ser: 1.12 mg/dL (ref 0.61–1.24)
GFR calc Af Amer: >60 mL/min (ref >60)
GFR calc non Af Amer: >60 mL/min (ref >60)
Glucose, Bld: 111 mg/dL — ABNORMAL HIGH (ref 70–99)
Potassium: 3.4 mmol/L — ABNORMAL LOW (ref 3.5–5.1)
Sodium: 139 mmol/L (ref 135–145)
Total Bilirubin: 1.3 mg/dL — ABNORMAL HIGH (ref 0.3–1.2)
Total Protein: 6.7 g/dL (ref 6.5–8.1)

## 2019-04-15 LAB — LIPASE, BLOOD: Lipase: 33 U/L (ref 11–51)

## 2019-04-15 MED ORDER — KETOROLAC TROMETHAMINE 30 MG/ML IJ SOLN
30.0000 mg | Freq: Once | INTRAMUSCULAR | Status: AC
  Administered 2019-04-15: 01:00:00 30 mg via INTRAVENOUS
  Filled 2019-04-15: qty 1

## 2019-04-15 NOTE — Discharge Instructions (Signed)
Drink plenty of fluids. ° °Take ibuprofen or acetaminophen as needed for pain. ° °Return if symptoms are getting worse. °

## 2019-04-15 NOTE — ED Notes (Signed)
Pt ambulated to RR to provide urine sample without assistance. Pt had steady gait and no complaints of dizziness or weakness.

## 2019-10-22 ENCOUNTER — Ambulatory Visit: Payer: 59 | Attending: Internal Medicine

## 2019-10-22 DIAGNOSIS — Z23 Encounter for immunization: Secondary | ICD-10-CM

## 2019-10-22 NOTE — Progress Notes (Signed)
   Covid-19 Vaccination Clinic  Name:  Tyler Galvan    MRN: [id number] DOB: 19-Nov-1960  10/22/2019  Tyler Galvan was observed post Covid-19 immunization for 15 minutes without incident. He was provided with Vaccine Information Sheet and instruction to access the V-Safe system.   Tyler Galvan was instructed to call 911 with any severe reactions post vaccine: Marland Kitchen Difficulty breathing  . Swelling of face and throat  . A fast heartbeat  . A bad rash all over body  . Dizziness and weakness   Immunizations Administered    Name Date Dose VIS Date Route   Pfizer COVID-19 Vaccine 10/22/2019  2:18 PM 0.3 mL 07/24/2019 Intramuscular   Manufacturer: [employer], [employer]   Lot: [id number]   NDC: [address]-1

## 2019-11-16 ENCOUNTER — Ambulatory Visit: Payer: 59 | Attending: Internal Medicine

## 2019-11-16 DIAGNOSIS — Z23 Encounter for immunization: Secondary | ICD-10-CM

## 2019-11-16 NOTE — Progress Notes (Signed)
   Covid-19 Vaccination Clinic  Name:  MARTEL GALVAN    MRN: [id number] DOB: [birth year]  11/16/2019  Mr. Pascal was observed post Covid-19 immunization for 15 minutes without incident. He was provided with Vaccine Information Sheet and instruction to access the V-Safe system.   Mr. Klasen was instructed to call 911 with any severe reactions post vaccine: Marland Kitchen Difficulty breathing  . Swelling of face and throat  . A fast heartbeat  . A bad rash all over body  . Dizziness and weakness   Immunizations Administered    Name Date Dose VIS Date Route   Pfizer COVID-19 Vaccine 11/16/2019 10:25 AM 0.3 mL 07/24/2019 Intramuscular   Manufacturer: [employer], [employer]   Lot: [id number]   NDC: [address]-6

## 2020-06-21 IMAGING — CT CT ABD-PELV W/ CM
2 of 5 series · 16 of 46 positions shown, 18 images · IV contrast (ISOVUE)
Comparison: None.

CLINICAL DATA: Abdominal pain and diarrhea. History of
diverticulitis.

EXAM:
CT ABDOMEN AND PELVIS WITH CONTRAST
TECHNIQUE: Multidetector CT imaging of the abdomen and pelvis was performed
using the standard protocol following bolus administration of
intravenous contrast.
CONTRAST:  100mL X4LVMI-HHH IOPAMIDOL (X4LVMI-HHH) INJECTION 61%

[Series 2: axial st · axial · 0.72mm/px · z∈[+1115,+1570]mm · 13 of 107 slices shown, 15 images]
[im 8/107  soft-tissue]
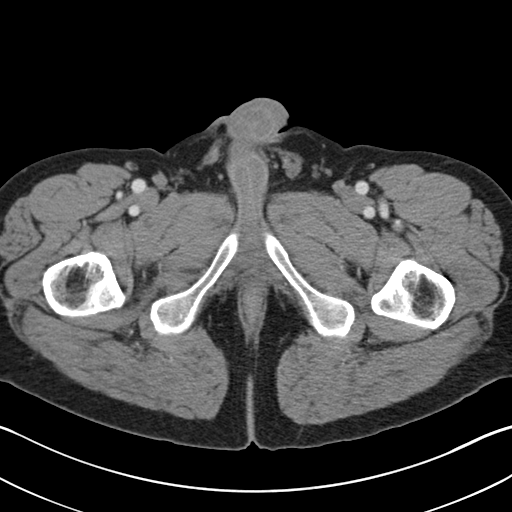
[im 8/107  bone]
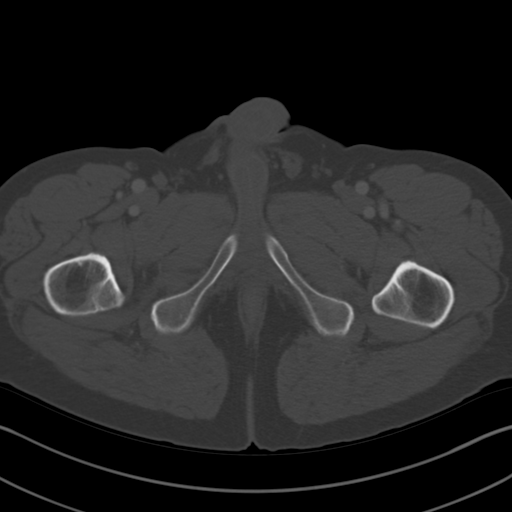
[im 16/107  soft-tissue]
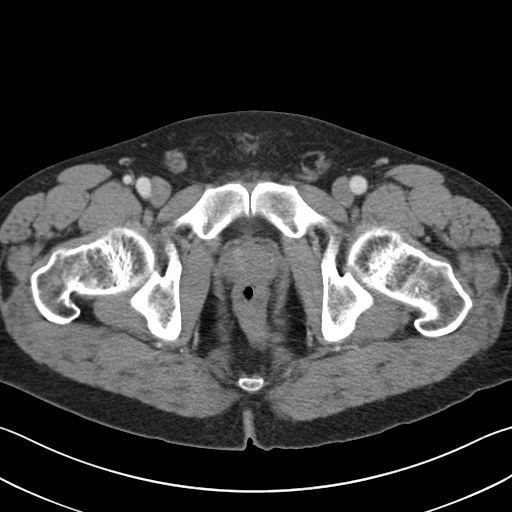
[im 23/107  soft-tissue]
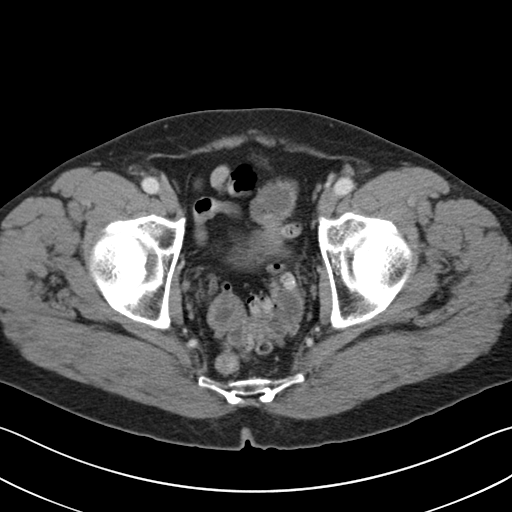
[im 31/107  soft-tissue]
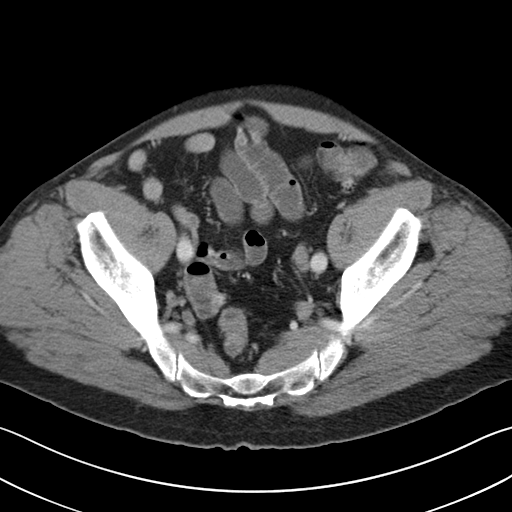
[im 38/107  soft-tissue]
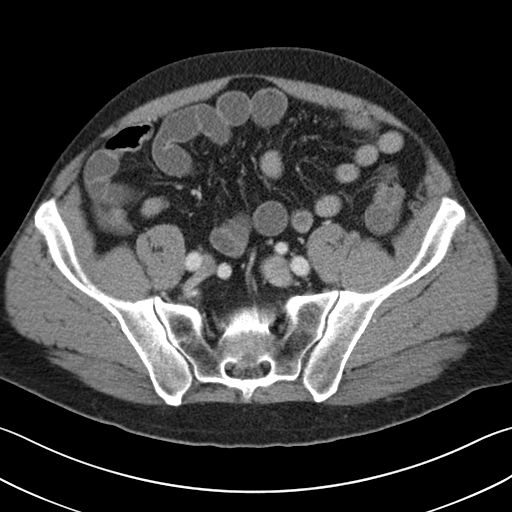
[im 46/107  soft-tissue]
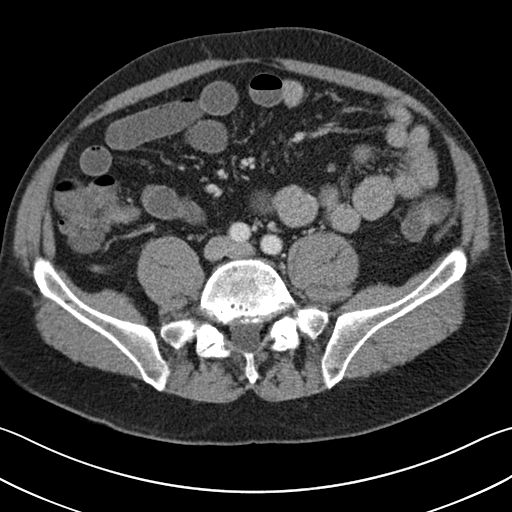
[im 54/107  soft-tissue]
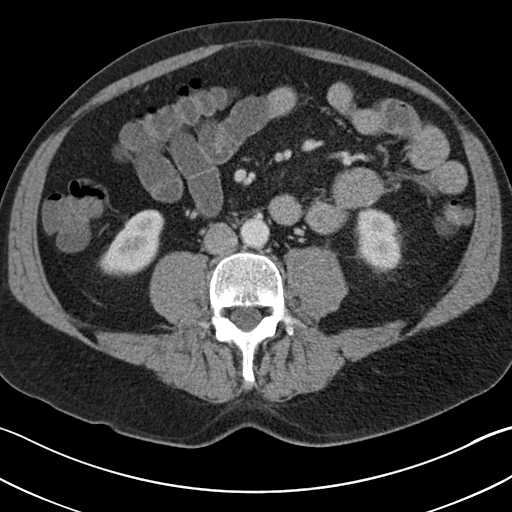
[im 61/107  soft-tissue]
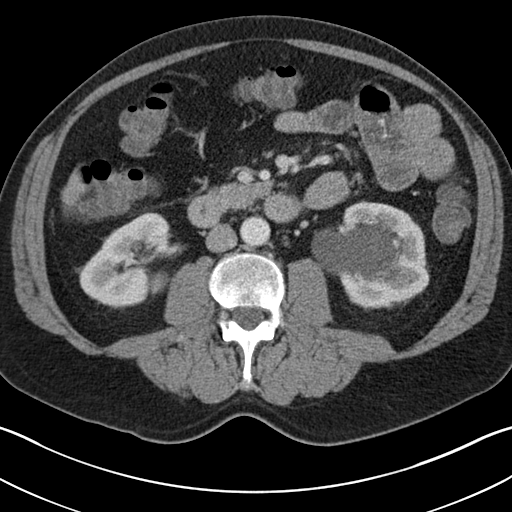
[im 69/107  soft-tissue]
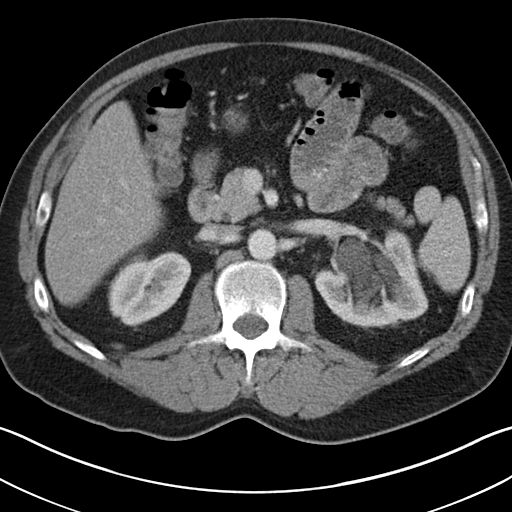
[im 69/107  bone]
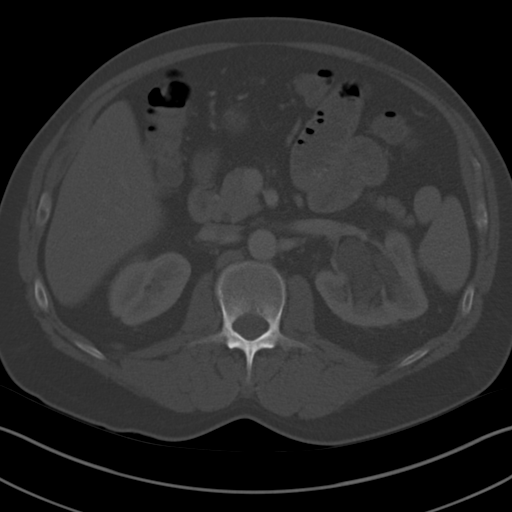
[im 76/107  soft-tissue]
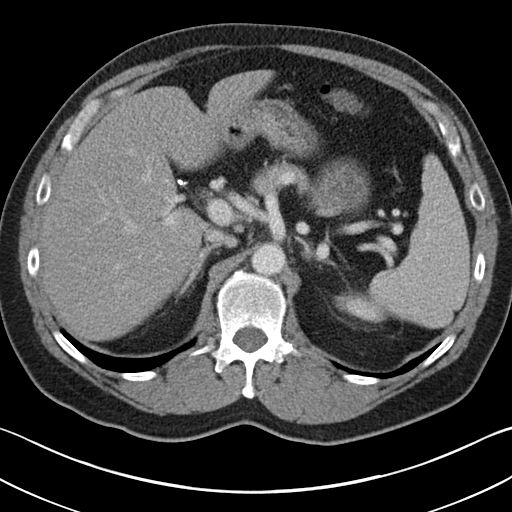
[im 84/107  soft-tissue]
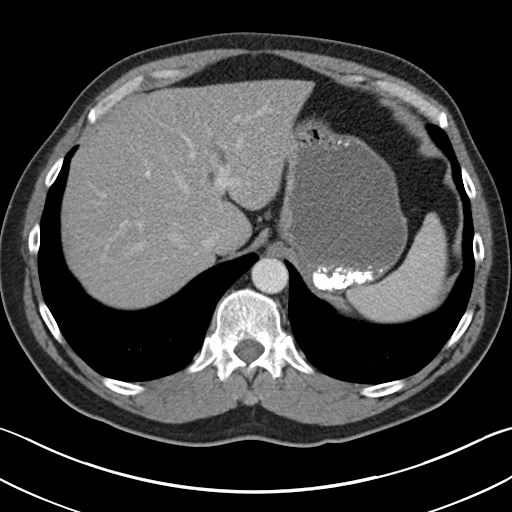
[im 91/107  soft-tissue]
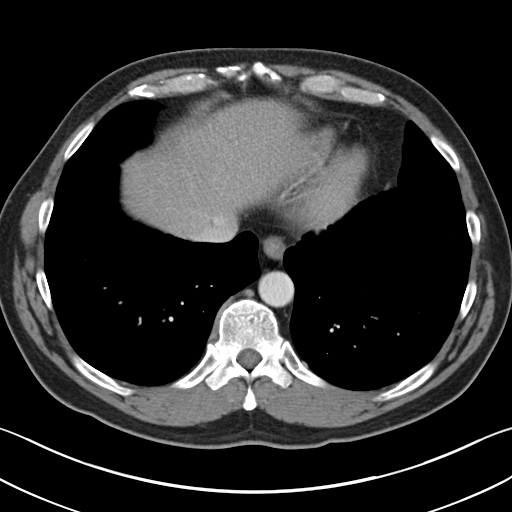
[im 99/107  soft-tissue]
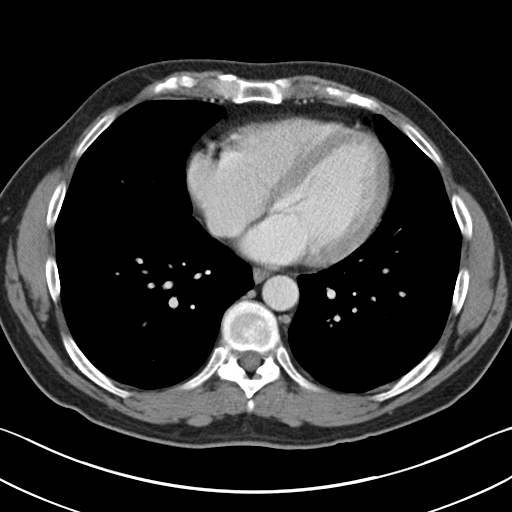

[Series 4: coronal st · coronal · 0.79mm/px · 3 of 101 slices shown]
[im 34/101  soft-tissue]
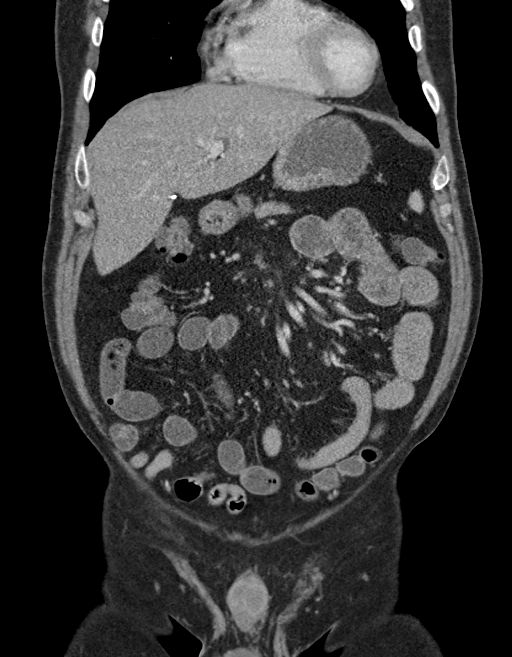
[im 45/101  soft-tissue]
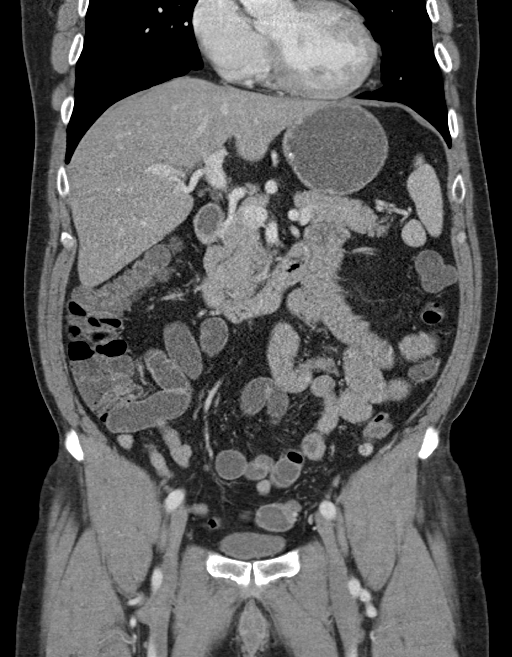
[im 56/101  soft-tissue]
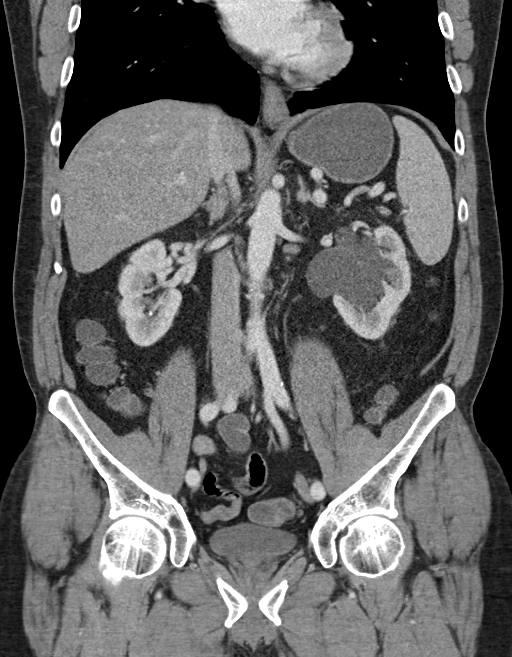

[16 of 46 positions shown; findings below may reference images not displayed]

FINDINGS: Lower chest: No acute abnormality.

Hepatobiliary: Diffuse hepatic steatosis. Status post
cholecystectomy. No biliary dilatation.

Pancreas: Unremarkable. No pancreatic ductal dilatation or
surrounding inflammatory changes.

Spleen: Normal in size without focal abnormality.

Adrenals/Urinary Tract: The adrenal glands are unremarkable.
Multiple parapelvic left renal simple cysts. Tiny bilateral renal
calculi. No ureteral calculi or hydronephrosis. The bladder is
unremarkable for the degree of distention.

Stomach/Bowel: Stomach is within normal limits. Appendix appears
normal. Fluid-filled small bowel and colon. No evidence of bowel
wall thickening, distention, or inflammatory changes. Extensive
sigmoid diverticulosis. There are a few right-sided colonic
diverticula.

Vascular/Lymphatic: No significant vascular findings are present. No
enlarged abdominal or pelvic lymph nodes.

Reproductive: Prostate is unremarkable.

Other: No abdominal wall hernia or abnormality. No abdominopelvic
ascites.

Musculoskeletal: No acute or significant osseous findings. Moderate
to severe degenerative disc disease at L5-S1.
IMPRESSION: 1. Fluid-filled non-dilated small bowel and colon which likely
reflects enterocolitis given clinical history.
2. Colonic diverticulosis without evidence of diverticulitis.
3. Hepatic steatosis.
4. Bilateral nonobstructive nephrolithiasis.

## 2021-04-30 ENCOUNTER — Emergency Department (HOSPITAL_COMMUNITY)
Admission: EM | Admit: 2021-04-30 | Discharge: 2021-05-01 | Disposition: A | Discharge status: Home / Self Care | Payer: 59 | Attending: Emergency Medicine | Admitting: Emergency Medicine

## 2021-04-30 ENCOUNTER — Encounter (HOSPITAL_COMMUNITY): Payer: Self-pay | Admitting: Emergency Medicine

## 2021-04-30 ENCOUNTER — Other Ambulatory Visit: Payer: Self-pay

## 2021-04-30 DIAGNOSIS — Z87891 Personal history of nicotine dependence: Secondary | ICD-10-CM | POA: Insufficient documentation

## 2021-04-30 DIAGNOSIS — N200 Calculus of kidney: Secondary | ICD-10-CM | POA: Diagnosis not present

## 2021-04-30 DIAGNOSIS — Z79899 Other long term (current) drug therapy: Secondary | ICD-10-CM | POA: Diagnosis not present

## 2021-04-30 DIAGNOSIS — Z794 Long term (current) use of insulin: Secondary | ICD-10-CM | POA: Insufficient documentation

## 2021-04-30 DIAGNOSIS — R109 Unspecified abdominal pain: Secondary | ICD-10-CM | POA: Diagnosis present

## 2021-04-30 DIAGNOSIS — J45909 Unspecified asthma, uncomplicated: Secondary | ICD-10-CM | POA: Diagnosis not present

## 2021-04-30 DIAGNOSIS — I10 Essential (primary) hypertension: Secondary | ICD-10-CM | POA: Insufficient documentation

## 2021-04-30 DIAGNOSIS — Z7982 Long term (current) use of aspirin: Secondary | ICD-10-CM | POA: Diagnosis not present

## 2021-04-30 MED ORDER — OXYCODONE-ACETAMINOPHEN 5-325 MG PO TABS
1.0000 | ORAL_TABLET | Freq: Once | ORAL | Status: AC
  Administered 2021-04-30: 1 via ORAL
  Filled 2021-04-30: qty 1

## 2021-04-30 MED ORDER — ONDANSETRON 4 MG PO TBDP
4.0000 mg | ORAL_TABLET | Freq: Once | ORAL | Status: AC
  Administered 2021-04-30: 4 mg via ORAL
  Filled 2021-04-30: qty 1

## 2021-04-30 NOTE — ED Provider Notes (Signed)
Emergency Medicine Provider Triage Evaluation Note  Tyler Galvan , a 60 y.o. male  was evaluated in triage.  Pt complains of acute onset of left-sided flank pain and lower back pain earlier this evening with associated vomiting.  He has a history of kidney stones as well as diverticulitis.  Pain is severe.  Review of Systems  Positive: Flank pain, vomiting Negative: Fever  Physical Exam  BP (!) 145/95 (BP Location: Left Arm)   Pulse 79   Temp 98.3 F (36.8 C) (Oral)   Resp 20   SpO2 100%  Gen:   Awake, appears uncomfortable holding emesis bag, diaphoretic Resp:  Normal effort, tachypneic MSK:   Moves extremities without difficulty  Other:  Patient reports pain left lower back, no tenderness  Medical Decision Making  Medically screening exam initiated at 10:50 PM.  Appropriate orders placed.  Tyler Galvan was informed that the remainder of the evaluation will be completed by another provider, this initial triage assessment does not replace that evaluation, and the importance of remaining in the ED until their evaluation is complete.  P.o. Percocet and ODT Zofran ordered in triage.   Renne Crigler, PA-C 04/30/21 2251    Sloan Leiter, DO 05/01/21 0028

## 2021-04-30 NOTE — ED Triage Notes (Signed)
Patient arrives complaining of left flank/back pain with associated vomiting. Patient hx of kidney stones and diverticulitis. Denies diarrhea. Patient endorses oliguria, but no frequency or dysuria.

## 2021-05-01 ENCOUNTER — Emergency Department (HOSPITAL_COMMUNITY): Payer: 59

## 2021-05-01 LAB — CBC WITH DIFFERENTIAL/PLATELET
Abs Immature Granulocytes: 0.05 10*3/uL (ref 0.00–0.07)
Basophils Absolute: 0 10*3/uL (ref 0.0–0.1)
Basophils Relative: 0 %
Eosinophils Absolute: 0 10*3/uL (ref 0.0–0.5)
Eosinophils Relative: 0 %
HCT: 44.9 % (ref 39.0–52.0)
Hemoglobin: 15.4 g/dL (ref 13.0–17.0)
Immature Granulocytes: 0 %
Lymphocytes Relative: 8 %
Lymphs Abs: 0.9 10*3/uL (ref 0.7–4.0)
MCH: 30.6 pg (ref 26.0–34.0)
MCHC: 34.3 g/dL (ref 30.0–36.0)
MCV: 89.3 fL (ref 80.0–100.0)
Monocytes Absolute: 0.9 10*3/uL (ref 0.1–1.0)
Monocytes Relative: 8 %
Neutro Abs: 9.6 10*3/uL — ABNORMAL HIGH (ref 1.7–7.7)
Neutrophils Relative %: 84 %
Platelets: 264 10*3/uL (ref 150–400)
RBC: 5.03 MIL/uL (ref 4.22–5.81)
RDW: 12.8 % (ref 11.5–15.5)
WBC: 11.5 10*3/uL — ABNORMAL HIGH (ref 4.0–10.5)
nRBC: 0 % (ref 0.0–0.2)

## 2021-05-01 LAB — URINALYSIS, ROUTINE W REFLEX MICROSCOPIC
Bacteria, UA: NONE SEEN
Bilirubin Urine: NEGATIVE
Glucose, UA: NEGATIVE mg/dL
Ketones, ur: NEGATIVE mg/dL
Leukocytes,Ua: NEGATIVE
Nitrite: NEGATIVE
Protein, ur: NEGATIVE mg/dL
Specific Gravity, Urine: 1.01 (ref 1.005–1.030)
pH: 6 (ref 5.0–8.0)

## 2021-05-01 LAB — BASIC METABOLIC PANEL
Anion gap: 8 (ref 5–15)
BUN: 24 mg/dL — ABNORMAL HIGH (ref 8–23)
CO2: 26 mmol/L (ref 22–32)
Calcium: 9.4 mg/dL (ref 8.9–10.3)
Chloride: 109 mmol/L (ref 98–111)
Creatinine, Ser: 1.35 mg/dL — ABNORMAL HIGH (ref 0.61–1.24)
GFR, Estimated: 60 mL/min — ABNORMAL LOW (ref >60)
Glucose, Bld: 124 mg/dL — ABNORMAL HIGH (ref 70–99)
Potassium: 4 mmol/L (ref 3.5–5.1)
Sodium: 143 mmol/L (ref 135–145)

## 2021-05-01 MED ORDER — HYDROMORPHONE HCL 1 MG/ML IJ SOLN
1.0000 mg | Freq: Once | INTRAMUSCULAR | Status: AC
Start: 2021-05-01 — End: 2021-05-01
  Administered 2021-05-01: 1 mg via INTRAVENOUS
  Filled 2021-05-01: qty 1

## 2021-05-01 MED ORDER — SODIUM CHLORIDE 0.9 % IV BOLUS
1000.0000 mL | Freq: Once | INTRAVENOUS | Status: AC
  Administered 2021-05-01: 1000 mL via INTRAVENOUS

## 2021-05-01 MED ORDER — HYDROMORPHONE HCL 1 MG/ML IJ SOLN
0.5000 mg | Freq: Once | INTRAMUSCULAR | Status: AC
  Administered 2021-05-01: 0.5 mg via INTRAVENOUS
  Filled 2021-05-01: qty 1

## 2021-05-01 MED ORDER — OXYCODONE-ACETAMINOPHEN 5-325 MG PO TABS: 1.0000 | ORAL_TABLET | Freq: Four times a day (QID) | ORAL | 0 refills | Status: DC | PRN

## 2021-05-01 MED ORDER — ONDANSETRON 4 MG PO TBDP: 4.0000 mg | ORAL_TABLET | Freq: Three times a day (TID) | ORAL | 0 refills | Status: DC | PRN

## 2021-05-01 NOTE — ED Provider Notes (Signed)
[facility]-EMERGENCY DEPT Provider Note   CSN: [id number] Arrival date & time: 04/30/21  2206     History Chief Complaint  Patient presents with   Flank Pain   Emesis    Tyler Galvan is a 60 y.o. male with a history of hypertension, hyperlipidemia, migraines, OSA on CPAP, diverticulosis, kidney stones, and prior cholecystectomy & hernia repair who presents to the emergency department with complaints of flank pain that began at 8 PM.  Pain is located to the left flank, radiates into the left abdomen, waxing and waning in severity, no alleviating or aggravating factors.  Associated nausea with multiple episodes of emesis.  Unsure if this feels like prior stones or prior diverticulitis.  Denies fever, chills, diarrhea, constipation, dysuria, or testicular pain/swelling.  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    per pt has always had positive skin test since 1980s, but CXR's have always been normal   HLD (hyperlipidemia) 05/10/2016   Hypertension    Migraines    neurologist-- dr Vincente Poli [facility] neurology in [place], [place])   OA (osteoarthritis)    fingers   OSA on CPAP    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    per pt "anything cold to my skin it causes hives and swelling around area"   Wears glasses     Patient Active Problem List   Diagnosis Date Noted   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   HLD (hyperlipidemia) 05/10/2016   Diverticulitis of colon 04/17/2016   Essential hypertension 04/11/2016   Migraine 04/11/2016   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       Family History  Problem Relation Age of Onset   Mental illness Mother        died of trauma   Cancer Father        kidney   Thalassemia Father    Thalassemia Brother    Mental illness Son    Mental illness Brother        opiod overdose   Cancer Paternal Grandfather        stomach   Alcohol abuse Paternal Grandfather     Social History   Tobacco Use   Smoking status: Never   Smokeless tobacco: Former    Types: Snuff    Quit date: 02/02/1979  Vaping Use   Vaping Use: Never used  Substance Use Topics   Alcohol use: No   Drug use: Never    Home Medications  Prior to Admission medications   Medication Sig Start Date End Date Taking? Authorizing Provider  ondansetron (ZOFRAN ODT) 4 MG disintegrating tablet Take 1 tablet (4 mg total) by mouth every 8 (eight) hours as needed for nausea or vomiting. 05/01/21  Yes Dandra Velardi R, PA-C  oxyCODONE-acetaminophen (PERCOCET/ROXICET) 5-325 MG tablet Take 1-2 tablets by mouth every 6 (six) hours as needed for severe pain. 05/01/21  Yes Twala Collings, Pleas Koch, PA-C  aspirin EC 81 MG tablet Take 81 mg by mouth daily.    [provider]  butorphanol (STADOL) 10 MG/ML nasal spray Place 1 spray into the nose as needed.  03/13/16   [provider]  fluticasone (FLONASE) 50 MCG/ACT nasal spray Place 1 spray into both nostrils every evening.     [provider]  indomethacin (INDOCIN SR) 75  MG CR capsule Take 1 capsule (75 mg total) by mouth daily. 02/03/19   Jiles Harold, PA-C  Insulin Syringe-Needle U-100 (INSULIN SYRINGE .5CC/31GX5/16") 31G X 5/16" 0.5 ML MISC TO BE USED WITH SUMATRIPTAN INJECTION 07/02/16   [provider]  Boris Lown Oil 1000 MG CAPS Take by mouth daily.    [provider]  lisinopril (PRINIVIL,ZESTRIL) 10 MG tablet TAKE 1 TABLET(10 MG) BY MOUTH DAILY Patient taking differently: Take 10 mg by mouth daily. TAKE 1 TABLET(10 MG) BY MOUTH DAILY 11/20/17   Eustace Moore, MD  Multiple Vitamin (MULTIVITAMIN) tablet Take 1 tablet by mouth daily.    [provider]  SUMAtriptan Willette Brace) 6 MG/0.5ML SOLN injection  03/13/16   [provider]  tamsulosin (FLOMAX) 0.4 MG CAPS capsule TAKE 1 CAPSULE(0.4 MG) BY MOUTH DAILY Patient taking differently: Take 0.4 mg by mouth. TAKE 1 CAPSULE(0.4 MG) BY MOUTH DAILY 11/20/17   Eustace Moore, MD  tiZANidine (ZANAFLEX) 4 MG tablet Take 1 tablet by mouth every 8 hours prn spasm 02/03/19   Jiles Harold, PA-C  Wheat Dextrin (BENEFIBER) POWD Take by mouth daily.    [provider]    Allergies    Amlodipine and Nifedipine  Review of Systems   Review of Systems  Constitutional:  Negative for chills and fever.  Respiratory:  Negative for shortness of breath.   Cardiovascular:  Negative for chest pain.  Gastrointestinal:  Positive for abdominal pain, nausea and vomiting. Negative for constipation and diarrhea.  Genitourinary:  Positive for flank pain. Negative for dysuria, scrotal swelling and testicular pain.  All other systems reviewed and are negative.  Physical Exam Updated Vital Signs BP 136/84   Pulse 65   Temp 98.4 F (36.9 C)   Resp 15   SpO2 98%   Physical Exam Vitals and nursing note reviewed.  Constitutional:      General: He is not in acute distress.    Appearance: He is well-developed. He is not toxic-appearing.  HENT:     Head: Normocephalic and  atraumatic.  Eyes:     General:        Right eye: No discharge.        Left eye: No discharge.     Conjunctiva/sclera: Conjunctivae normal.  Cardiovascular:     Rate and Rhythm: Normal rate and regular rhythm.  Pulmonary:     Effort: Pulmonary effort is normal. No respiratory distress.     Breath sounds: Normal breath sounds. No wheezing, rhonchi or rales.  Abdominal:     General: There is no distension.     Palpations: Abdomen is soft.     Tenderness: There is no  abdominal tenderness (mild left LLQ). There is left CVA tenderness. There is no right CVA tenderness, guarding or rebound.  Musculoskeletal:     Cervical back: Neck supple.  Skin:    General: Skin is warm and dry.     Findings: No rash.  Neurological:     Mental Status: He is alert.     Comments: Clear speech.   Psychiatric:        Behavior: Behavior normal.    ED Results / Procedures / Treatments   Labs (all labs ordered are listed, but only abnormal results are displayed) Labs Reviewed  CBC WITH DIFFERENTIAL/PLATELET - Abnormal; Notable for the following components:      Result Value   WBC 11.5 (*)    Neutro Abs 9.6 (*)    All other components within normal limits  BASIC METABOLIC PANEL - Abnormal; Notable for the following components:   Glucose, Bld 124 (*)    BUN 24 (*)    Creatinine, Ser 1.35 (*)    GFR, Estimated 60 (*)    All other components within normal limits  URINALYSIS, ROUTINE W REFLEX MICROSCOPIC - Abnormal; Notable for the following components:   Color, Urine YELLOW (*)    APPearance CLEAR (*)    Hgb urine dipstick SMALL (*)    All other components within normal limits    EKG None  Radiology CT Renal Stone Study  Result Date: 05/01/2021 CLINICAL DATA:  Flank pain, left, kidney stones suspected EXAM: CT ABDOMEN AND PELVIS WITHOUT CONTRAST TECHNIQUE: Multidetector CT imaging of the abdomen and pelvis was performed following the standard protocol without IV contrast. COMPARISON:   04/14/2019 FINDINGS: Lower chest: Linear opacity in the right lower lobe, unchanged from the prior exam, likely scarring. No pleural effusion. No pericardial effusion. Hepatobiliary: No focal liver abnormality is seen. Focal fat adjacent to the falciform ligament. Status post cholecystectomy. No biliary dilatation. Pancreas: Unremarkable. No pancreatic ductal dilatation or surrounding inflammatory changes. Spleen: Normal in size without focal abnormality. Adrenals/Urinary Tract: The adrenal glands are unremarkable. Redemonstrated multiple left parapelvic cysts. Punctate calcification in the superior right kidney, which is new from prior exam. Unchanged punctate calcifications in the inferior left kidney. Increased perinephric stranding about the left kidney. Possible mild stranding about the left ureter, which appears mildly prominent, with a 2 mm calcification near the left UVJ, possibly ureteral. Unchanged mild right perinephric stranding, without right hydronephrosis. Nonobstructing nephrolithiasis in the inferior right kidney, unchanged from the prior exam. The left ureter is unremarkable. The bladder is unremarkable. Stomach/Bowel: Stomach is within normal limits. Appendix appears normal. No evidence of bowel wall thickening, distention, or inflammatory changes. Diverticulosis without evidence of diverticulitis. Vascular/Lymphatic: No significant vascular findings are present. No enlarged abdominal or pelvic lymph nodes. Reproductive: Prostate is present. Other: No free fluid or free air in the abdomen or pelvis. Musculoskeletal: Degenerative changes in the lumbar spine from worst at L5-S1 IMPRESSION: Calcification near the left ureterovesical junction, favored to be urolithiasis, with mild left periureteral stranding and mild left perinephric stranding. No definite hydronephrosis, although evaluation is somewhat limited by chronic multiple parapelvic cysts in the left kidney. Electronically Signed   By: Wiliam Ke M.D.   On: 05/01/2021 03:26    Procedures Procedures   Medications Ordered in ED Medications  ondansetron (ZOFRAN-ODT) disintegrating tablet 4 mg (4 mg Oral Given 04/30/21 2253)  oxyCODONE-acetaminophen (PERCOCET/ROXICET) 5-325 MG per tablet 1 tablet (1 tablet Oral Given 04/30/21 2330)  sodium chloride 0.9 % bolus 1,000 mL (  0 mLs Intravenous Stopped 05/01/21 0427)  HYDROmorphone (DILAUDID) injection 0.5 mg (0.5 mg Intravenous Given 05/01/21 0032)  HYDROmorphone (DILAUDID) injection 1 mg (1 mg Intravenous Given 05/01/21 0436)    ED Course  I have reviewed the triage vital signs and the nursing notes.  Pertinent labs & imaging results that were available during my care of the patient were reviewed by me and considered in my medical decision making (see chart for details).    MDM Rules/Calculators/A&P                           Patient presents to the ED with complaints of left flank pain.  Nontoxic, vitals are significant abnormality.  Additional history obtained:  Additional history obtained from chart review & nursing note review.   Lab Tests:  I Ordered, reviewed, and interpreted labs, which included:  CBC: Mild cytosis felt to be nonspecific BMP: Mildly increased BUN and creatinine, no significant electrolyte derangement Urinalysis: Hematuria, no UTI.  Imaging Studies ordered:  I ordered imaging studies which included CT renal stone study, I independently reviewed, formal radiology impression shows:  Calcification near the left ureterovesical junction, favored to be urolithiasis, with mild left periureteral stranding and mild left perinephric stranding. No definite hydronephrosis, although evaluation is somewhat limited by chronic multiple parapelvic cysts in the left kidney  ED Course:  04:30: RE-EVAL: Initial pain improved, now creeping back up to a 7/10 in severity, will give additional analgesics & fluids challenge.   06:00: RE-EVAL: Patient feeling much better,  tolerating p.o., feels ready to go home.  Overall appears appropriate for discharge home.  Likely symptomatic from his left UVJ stone, renal function minimally elevated compared to prior, urinalysis not UTI, tolerating p.o. with pain control therefore feel he is appropriate for discharge home with urology follow-up.  He already takes Flomax. I discussed results, treatment plan, need for follow-up, and return precautions with the patient. Provided opportunity for questions, patient confirmed understanding and is in agreement with plan.   Portions of this note were generated with Scientist, clinical (histocompatibility and immunogenetics). Dictation errors may occur despite best attempts at proofreading.  Final Clinical Impression(s) / ED Diagnoses Final diagnoses:  Kidney stone    Rx / DC Orders ED Discharge Orders          Ordered    oxyCODONE-acetaminophen (PERCOCET/ROXICET) 5-325 MG tablet  Every 6 hours PRN        05/01/21 0613    ondansetron (ZOFRAN ODT) 4 MG disintegrating tablet  Every 8 hours PRN        05/01/21 0613             Cherly Anderson, PA-C 05/01/21 8416    Paula Libra, MD 05/01/21 1051

## 2021-05-01 NOTE — Discharge Instructions (Signed)
You were seen in the emergency department and found to have a kidney stone.  We are sending you home with multiple medications to assist with passing the stone:   -Percocet-this is a narcotic/controlled substance medication that has potential addicting qualities.  We recommend that you take 1-2 tablets every 6 hours as needed for severe pain.  Do not drive or operate heavy machinery when taking this medicine as it can be sedating. Do not drink alcohol or take other sedating medications when taking this medicine for safety reasons.  Keep this out of reach of small children.  Please be aware this medicine has Tylenol in it (325 mg/tab) do not exceed the maximum dose of Tylenol in a day per over the counter recommendations should you decide to supplement with Tylenol over the counter.   -Zofran-this is an antinausea medication, you may take this every 8 hours as needed for nausea and vomiting, please allow the tablet to dissolve underneath of your tongue.   We have prescribed you new medication(s) today. Discuss the medications prescribed today with your pharmacist as they can have adverse effects and interactions with your other medicines including over the counter and prescribed medications. Seek medical evaluation if you start to experience new or abnormal symptoms after taking one of these medicines, seek care immediately if you start to experience difficulty breathing, feeling of your throat closing, facial swelling, or rash as these could be indications of a more serious allergic reaction  Your kidney function was mildly elevated compared to prior, please have this rechecked by urology or your primary care provider. Please follow-up with the urology group provided in your discharge instructions within 3 to 5 days.  Return to the ER for new or worsening symptoms including but not limited to worsening pain not controlled by these medicines, inability to keep fluids down, fever, or any other concerns that  you may have.

## 2021-05-01 NOTE — ED Notes (Signed)
Pt drank ginger ale without experiencing any nausea.

## 2023-02-05 ENCOUNTER — Ambulatory Visit (INDEPENDENT_AMBULATORY_CARE_PROVIDER_SITE_OTHER): Payer: Commercial Managed Care - PPO

## 2023-02-05 ENCOUNTER — Ambulatory Visit
Admission: RE | Admit: 2023-02-05 | Discharge: 2023-02-05 | Disposition: A | Discharge status: Home / Self Care | Payer: Commercial Managed Care - PPO | Source: Ambulatory Visit | Attending: Emergency Medicine | Admitting: Emergency Medicine

## 2023-02-05 VITALS — BP 121/83 | HR 73 | Temp 98.1°F | Resp 16

## 2023-02-05 DIAGNOSIS — R051 Acute cough: Secondary | ICD-10-CM | POA: Diagnosis not present

## 2023-02-05 DIAGNOSIS — J069 Acute upper respiratory infection, unspecified: Secondary | ICD-10-CM

## 2023-02-05 MED ORDER — AMOXICILLIN-POT CLAVULANATE 875-125 MG PO TABS: 1.0000 | ORAL_TABLET | Freq: Two times a day (BID) | ORAL | 0 refills | Status: AC

## 2023-02-05 MED ORDER — PROMETHAZINE-DM 6.25-15 MG/5ML PO SYRP: 5.0000 mL | ORAL_SOLUTION | Freq: Four times a day (QID) | ORAL | 0 refills | Status: DC | PRN

## 2023-02-05 MED ORDER — IPRATROPIUM BROMIDE 0.06 % NA SOLN: 2.0000 | Freq: Four times a day (QID) | NASAL | 12 refills | Status: DC

## 2023-02-05 MED ORDER — BENZONATATE 100 MG PO CAPS: 200.0000 mg | ORAL_CAPSULE | Freq: Three times a day (TID) | ORAL | 0 refills | Status: DC

## 2023-02-05 NOTE — Discharge Instructions (Addendum)
Your chest x-ray did not show any active cardiopulmonary disease such as pneumonia.  I do believe that your cough is being driven by your postnasal drip and that you have an upper respiratory infection.  Take the Augmentin twice daily with food for 10 days for treatment of your upper respiratory infection.  Use over-the-counter Tylenol and or ibuprofen as needed for pain.  Use the Atrovent nasal spray, 2 squirts in each nostril every 6 hours, as needed for runny nose and postnasal drip.  Use the Tessalon Perles every 8 hours during the day.  Take them with a small sip of water.  They may give you some numbness to the base of your tongue or a metallic taste in your mouth, this is normal.  Use the Promethazine DM cough syrup at bedtime for cough and congestion.  It will make you drowsy so do not take it during the day.  Return for reevaluation or see your primary care provider for any new or worsening symptoms.

## 2023-02-05 NOTE — ED Triage Notes (Signed)
Pt presents with a cough and nasal congestion x 2 weeks. For the past few days he developed chest pain. The pain only occurs when he coughs. He has tried OTC cough syrup with no relief.

## 2023-02-05 NOTE — ED Provider Notes (Signed)
[facility]    CSN: [id number] Arrival date & time: 02/05/23  1047      History   Chief Complaint Chief Complaint  Patient presents with   Cough    Chest hurting and a lot of congestion. - Entered by patient   Nasal Congestion    HPI Tyler Galvan is a 62 y.o. male.   HPI  6 old male with a past medical history significant for obstructive sleep apnea on CPAP, history of positive PPD, asthma, diverticulosis, BPH, chronic allergic seasonal rhinitis, and hiatal hernia presents for evaluation of 2 weeks with a respiratory symptoms to include nasal congestion with postnasal drip and cough that is productive for a thick yellow sputum.  He denies any fever, shortness breath, or wheezing.  He reports that the sputum production has gotten worse over the last 2 days and he started to have pain in his upper chest with coughing only.  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    per pt has always had positive skin test since 1980s, but CXR's have always been normal   HLD (hyperlipidemia) 05/10/2016   Hypertension    Migraines    neurologist-- dr Vincente Poli [facility] neurology in [place], [place])   OA (osteoarthritis)    fingers   OSA on CPAP    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    per pt "anything cold to my skin it causes hives and swelling around area"   Wears glasses     Patient Active Problem List   Diagnosis Date Noted   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   HLD (hyperlipidemia) 05/10/2016   Diverticulitis of colon 04/17/2016   Essential hypertension 04/11/2016   Migraine 04/11/2016   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 04/11/2016   Environmental  allergies 04/11/2016   BPH (benign prostatic hyperplasia) 04/11/2016   OSA on CPAP 04/11/2016   Chronic insomnia 04/11/2016    Past Surgical History:  Procedure Laterality Date   BICEPS TENDON REPAIR Left 02/03/2019   Procedure: DISTAL BICEPS TENDON REPAIR;  Surgeon: Jones Broom, MD;  Location: [facility];  Service: Orthopedics;  Laterality: Left;   COLONOSCOPY  last one 2015   LAPAROSCOPIC CHOLECYSTECTOMY  1998   ORCHIOPEXY Bilateral 1968   undescended testis   TONSILLECTOMY AND ADENOIDECTOMY  1968   UMBILICAL HERNIA REPAIR  2015 approx.       Home Medications    Prior to Admission medications   Medication Sig Start Date End Date Taking? Authorizing Provider  amoxicillin-clavulanate (AUGMENTIN) 875-125 MG tablet Take 1 tablet by mouth every 12 (twelve) hours for 10 days. 02/05/23 02/15/23 Yes Becky Augusta, NP  benzonatate (TESSALON) 100 MG capsule Take 2 capsules (200 mg total) by mouth every 8 (eight) hours. 02/05/23  Yes Becky Augusta, NP  ipratropium (ATROVENT) 0.06 % nasal spray Place 2 sprays into both nostrils 4 (four) times daily. 02/05/23  Yes Becky Augusta, NP  Boris Lown Oil 1000 MG CAPS Take by mouth daily.   Yes [provider]  lisinopril (PRINIVIL,ZESTRIL) 10 MG tablet TAKE 1 TABLET(10 MG) BY MOUTH DAILY Patient  taking differently: Take 10 mg by mouth daily. TAKE 1 TABLET(10 MG) BY MOUTH DAILY 11/20/17  Yes Eustace Moore, MD  Multiple Vitamin (MULTIVITAMIN) tablet Take 1 tablet by mouth daily.   Yes [provider]  promethazine-dextromethorphan (PROMETHAZINE-DM) 6.25-15 MG/5ML syrup Take 5 mLs by mouth 4 (four) times daily as needed. 02/05/23  Yes Becky Augusta, NP  SUMAtriptan Willette Brace) 6 MG/0.5ML SOLN injection  03/13/16  Yes [provider]  tamsulosin (FLOMAX) 0.4 MG CAPS capsule TAKE 1 CAPSULE(0.4 MG) BY MOUTH DAILY Patient taking differently: Take 0.4 mg by mouth. TAKE 1 CAPSULE(0.4 MG) BY MOUTH DAILY 11/20/17  Yes Eustace Moore, MD  aspirin EC 81 MG tablet Take 81 mg by mouth daily.    [provider]  butorphanol (STADOL) 10 MG/ML nasal spray Place 1 spray into the nose as needed.  03/13/16   [provider]  fluticasone (FLONASE) 50 MCG/ACT nasal spray Place 1 spray into both nostrils every evening.     [provider]  indomethacin (INDOCIN SR) 75 MG CR capsule Take 1 capsule (75 mg total) by mouth daily. 02/03/19   Jiles Harold, PA-C  Insulin Syringe-Needle U-100 (INSULIN SYRINGE .5CC/31GX5/16") 31G X 5/16" 0.5 ML MISC TO BE USED WITH SUMATRIPTAN INJECTION 07/02/16   [provider]  ondansetron (ZOFRAN ODT) 4 MG disintegrating tablet Take 1 tablet (4 mg total) by mouth every 8 (eight) hours as needed for nausea or vomiting. 05/01/21   Petrucelli, Lelon Mast R, PA-C  oxyCODONE-acetaminophen (PERCOCET/ROXICET) 5-325 MG tablet Take 1-2 tablets by mouth every 6 (six) hours as needed for severe pain. 05/01/21   Petrucelli, Lelon Mast R, PA-C  tiZANidine (ZANAFLEX) 4 MG tablet Take 1 tablet by mouth every 8 hours prn spasm 02/03/19   Jiles Harold, PA-C  Wheat Dextrin (BENEFIBER) POWD Take by mouth daily.    [provider]    Family History Family History  Problem Relation Age of Onset   Mental illness Mother        died of trauma   Cancer Father        kidney   Thalassemia Father    Thalassemia Brother    Mental illness Son    Mental illness Brother        opiod overdose   Cancer Paternal Grandfather        stomach   Alcohol abuse Paternal Grandfather     Social History Social History   Tobacco Use   Smoking status: Never   Smokeless tobacco: Former    Types: Snuff    Quit date: 02/02/1979  Vaping Use   Vaping Use: Never used  Substance Use Topics   Alcohol use: No   Drug use: Never     Allergies   Amlodipine and Nifedipine   Review of Systems Review of Systems  Constitutional:  Negative for fever.  HENT:  Positive for congestion  and postnasal drip. Negative for rhinorrhea.   Respiratory:  Positive for cough. Negative for shortness of breath and wheezing.   Cardiovascular:  Positive for chest pain.     Physical Exam Triage Vital Signs ED Triage Vitals [02/05/23 1108]  Enc Vitals Group     BP      Pulse      Resp      Temp      Temp src      SpO2      Weight      Height      Head Circumference  Peak Flow      Pain Score 2     Pain Loc      Pain Edu?      Excl. in GC?    No data found.  Updated Vital Signs BP 121/83 (BP Location: Left Arm)   Pulse 73   Temp 98.1 F (36.7 C) (Oral)   Resp 16   SpO2 97%   Visual Acuity Right Eye Distance:   Left Eye Distance:   Bilateral Distance:    Right Eye Near:   Left Eye Near:    Bilateral Near:     Physical Exam Vitals and nursing note reviewed.  Constitutional:      Appearance: Normal appearance. He is not ill-appearing.  HENT:     Head: Normocephalic and atraumatic.     Right Ear: Tympanic membrane, ear canal and external ear normal. There is no impacted cerumen.     Left Ear: Tympanic membrane, ear canal and external ear normal. There is no impacted cerumen.     Nose: Congestion and rhinorrhea present.     Comments: Nasal mucosa is erythematous and edematous with clear discharge in both nares.    Mouth/Throat:     Mouth: Mucous membranes are moist.     Pharynx: Oropharynx is clear. Posterior oropharyngeal erythema present. No oropharyngeal exudate.     Comments: Posterior oropharynx is erythematous and injected with clear postnasal drip. Cardiovascular:     Rate and Rhythm: Normal rate and regular rhythm.     Pulses: Normal pulses.     Heart sounds: Normal heart sounds. No murmur heard.    No friction rub. No gallop.  Pulmonary:     Effort: Pulmonary effort is normal.     Breath sounds: Normal breath sounds. No wheezing, rhonchi or rales.  Musculoskeletal:     Cervical back: Normal range of motion and neck supple.  Lymphadenopathy:      Cervical: No cervical adenopathy.  Skin:    General: Skin is warm and dry.     Capillary Refill: Capillary refill takes less than 2 seconds.     Findings: No erythema or rash.  Neurological:     General: No focal deficit present.     Mental Status: He is alert and oriented to person, place, and time.      UC Treatments / Results  Labs (all labs ordered are listed, but only abnormal results are displayed) Labs Reviewed - No data to display  EKG Normal sinus rhythm with ventricular rate of 68 bpm PR interval 152 ms QRS duration 86 ms QT/QTc 418/444 ms No ST or T wave abnormalities.  Radiology DG Chest 2 View  Result Date: 02/05/2023 CLINICAL DATA:  Productive cough EXAM: CHEST - 2 VIEW COMPARISON:  None Available. FINDINGS: The heart size and mediastinal contours are within normal limits. Both lungs are clear. The visualized skeletal structures are unremarkable. IMPRESSION: No active cardiopulmonary disease. Electronically Signed   By: Duanne Guess D.O.   On: 02/05/2023 12:01    Procedures Procedures (including critical care time)  Medications Ordered in UC Medications - No data to display  Initial Impression / Assessment and Plan / UC Course  I have reviewed the triage vital signs and the nursing notes.  Pertinent labs & imaging results that were available during my care of the patient were reviewed by me and considered in my medical decision making (see chart for details).   Patient is a pleasant, nontoxic-appearing 62 year old male presenting for evaluation of respiratory symptoms  and pleuritic chest pain as outlined HPI above.  On exam patient is able to speak in full sentence without dyspnea or tachypnea.  His respiratory rate at triage was 16 with a 97% room air oxygen saturation.  He is afebrile at 98.1.  His physical exam does reveal inflammation of his upper respiratory tract but his cardiopulmonary exam reveals clear lung sounds in all fields.  I did obtain an  EKG at triage due to the patient complaining of chest pain and that EKG shows normal sinus rhythm without any ectopy or ST or T wave abnormalities.  There is no change when compared to EKG from 02/03/2019.  I will order a chest x-ray to evaluate for the presence of acute cardiopulmonary disease.  Chest x-ray independently reviewed and evaluated by me.  Pression: Lung fields are well-pneumatized.  Pulmonary vasculature is prominent.  There is a questionable bleb in the right upper lobe but no definitive infiltrate or effusion.  Radiology overread is pending. Radiology impression states that the heart size and mediastinal contours are within normal limits and both lungs are clear.  No active cardiopulmonary disease.  I will discharge patient home with a diagnosis of upper respiratory infection.  Given that he has had symptoms for 2 weeks I will do a trial of Augmentin 875 mg twice daily for 10 days.  Also intranasal sprays of the nasal congestion and postnasal drip.  Tessalon Perles and Promethazine DM cough syrup for cough and congestion.  Return precautions reviewed.  Final Clinical Impressions(s) / UC Diagnoses   Final diagnoses:  Upper respiratory tract infection, unspecified type  Acute cough     Discharge Instructions      Your chest x-ray did not show any active cardiopulmonary disease such as pneumonia.  I do believe that your cough is being driven by your postnasal drip and that you have an upper respiratory infection.  Take the Augmentin twice daily with food for 10 days for treatment of your upper respiratory infection.  Use over-the-counter Tylenol and or ibuprofen as needed for pain.  Use the Atrovent nasal spray, 2 squirts in each nostril every 6 hours, as needed for runny nose and postnasal drip.  Use the Tessalon Perles every 8 hours during the day.  Take them with a small sip of water.  They may give you some numbness to the base of your tongue or a metallic taste in your  mouth, this is normal.  Use the Promethazine DM cough syrup at bedtime for cough and congestion.  It will make you drowsy so do not take it during the day.  Return for reevaluation or see your primary care provider for any new or worsening symptoms.      ED Prescriptions     Medication Sig Dispense Auth. Provider   amoxicillin-clavulanate (AUGMENTIN) 875-125 MG tablet Take 1 tablet by mouth every 12 (twelve) hours for 10 days. 20 tablet Becky Augusta, NP   benzonatate (TESSALON) 100 MG capsule Take 2 capsules (200 mg total) by mouth every 8 (eight) hours. 21 capsule Becky Augusta, NP   ipratropium (ATROVENT) 0.06 % nasal spray Place 2 sprays into both nostrils 4 (four) times daily. 15 mL Becky Augusta, NP   promethazine-dextromethorphan (PROMETHAZINE-DM) 6.25-15 MG/5ML syrup Take 5 mLs by mouth 4 (four) times daily as needed. 118 mL Becky Augusta, NP      PDMP not reviewed this encounter.   Becky Augusta, NP 02/05/23 [phone]

## 2023-02-07 ENCOUNTER — Ambulatory Visit
Admission: EM | Admit: 2023-02-07 | Discharge: 2023-02-07 | Disposition: A | Discharge status: Home / Self Care | Payer: Commercial Managed Care - PPO | Attending: Emergency Medicine | Admitting: Emergency Medicine

## 2023-02-07 ENCOUNTER — Encounter: Payer: Self-pay | Admitting: Emergency Medicine

## 2023-02-07 DIAGNOSIS — R21 Rash and other nonspecific skin eruption: Secondary | ICD-10-CM

## 2023-02-07 MED ORDER — PREDNISONE 20 MG PO TABS: 40.0000 mg | ORAL_TABLET | Freq: Every day | ORAL | 0 refills | Status: DC

## 2023-02-07 MED ORDER — AZITHROMYCIN 250 MG PO TABS: 250.0000 mg | ORAL_TABLET | Freq: Every day | ORAL | 0 refills | Status: DC

## 2023-02-07 NOTE — Discharge Instructions (Addendum)
Today you have been evaluated for your rash, unknown etiology however the only new outlier is introduction of antibiotic and even though you have used this in the past this could be the cause of your rash as no other factors have changed therefore stop use of Augmentin  Begin azithromycin as directed to finish treating respiratory symptoms  Begin prednisone every morning with food for 5 days to reduce the inflammatory process most likely causing the rash as there is no current signs of infection  Blister that is present on the toe and in the mouth appears to be non harmful  Area on the toe may be cleansed with soap and water daily and you may continue to apply Neosporin until healed  May complete salt water gargles for treatment of mouth blisters, avoid spicy or acidic foods such as citrus as this may sometimes cause irritation  If your rash continues to persist you may follow-up for reevaluation

## 2023-02-07 NOTE — ED Triage Notes (Signed)
Pt presents with a rash on his groin area, the sides of his abdomen he noticed yesterday, but thinks it could have been there longer. He also has a blister in his mouth and his left big toe.

## 2023-02-07 NOTE — ED Provider Notes (Signed)
[facility]    CSN: [id number] Arrival date & time: 02/07/23  1440      History   Chief Complaint Chief Complaint  Patient presents with   Rash    HPI Tyler Galvan is a 62 y.o. male.   Patient presents for evaluation of a erythematous rash described as" little red dots" present to the groin and abdomen first noticed this morning.  Has noticed a blister to the left second toe and a blister underneath the tongue this morning as well both blisters have opened and drained blood.  Rash is mildly pruritic.  Denies pain or drainage.  Denies changes in toiletries, diet or recent travel.  Recently evaluated in urgent care 2 days ago for upper respiratory infection, started on Augmentin as well as prescribed Tessalon and Promethazine DM, has taken all medications in the past without complication.  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    per pt has always had positive skin test since 1980s, but CXR's have always been normal   HLD (hyperlipidemia) 05/10/2016   Hypertension    Migraines    neurologist-- dr Vincente Poli [facility] neurology in [place], [place])   OA (osteoarthritis)    fingers   OSA on CPAP    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    per pt "anything cold to my skin it causes hives and swelling around area"   Wears glasses     Patient Active Problem List   Diagnosis Date Noted   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   HLD (hyperlipidemia) 05/10/2016   Diverticulitis of colon 04/17/2016   Essential hypertension 04/11/2016   Migraine 04/11/2016   Urticaria due to  cold 04/11/2016   Environmental allergies 04/11/2016   BPH (benign prostatic hyperplasia) 04/11/2016   OSA on CPAP 04/11/2016   Chronic insomnia 04/11/2016    Past Surgical History:  Procedure Laterality Date   BICEPS TENDON REPAIR Left 02/03/2019   Procedure: DISTAL BICEPS TENDON REPAIR;  Surgeon: Jones Broom, MD;  Location: [facility];  Service: Orthopedics;  Laterality: Left;   COLONOSCOPY  last one 2015   LAPAROSCOPIC CHOLECYSTECTOMY  1998   ORCHIOPEXY Bilateral 1968   undescended testis   TONSILLECTOMY AND ADENOIDECTOMY  1968   UMBILICAL HERNIA REPAIR  2015 approx.       Home Medications    Prior to Admission medications   Medication Sig Start Date End Date Taking? Authorizing Provider  azithromycin (ZITHROMAX) 250 MG tablet Take 1 tablet (250 mg total) by mouth daily. Take first 2 tablets together, then 1 every day until finished. 02/07/23  Yes Yuleni Burich R, NP  predniSONE (DELTASONE) 20 MG tablet Take 2 tablets (40 mg total) by mouth daily. 02/07/23  Yes Zae Kirtz, Elita Boone, NP  amoxicillin-clavulanate (AUGMENTIN) 875-125 MG tablet Take 1 tablet by mouth every 12 (twelve) hours for 10 days. 02/05/23 02/15/23  Becky Augusta, NP  aspirin EC 81 MG tablet Take 81 mg by mouth daily.    [provider]  benzonatate (TESSALON) 100 MG capsule Take 2 capsules (200 mg total) by mouth every 8 (eight) hours. 02/05/23   Becky Augusta, NP  butorphanol (STADOL) 10 MG/ML nasal spray Place 1 spray into the nose as needed.  03/13/16   [provider]  fluticasone (FLONASE) 50 MCG/ACT nasal spray Place 1 spray into both nostrils every evening.     [provider]  indomethacin (INDOCIN SR) 75 MG CR capsule Take 1 capsule (75 mg total) by mouth daily. 02/03/19   Jiles Harold, PA-C  Insulin Syringe-Needle U-100 (INSULIN SYRINGE .5CC/31GX5/16") 31G X 5/16" 0.5 ML MISC TO BE USED WITH SUMATRIPTAN INJECTION 07/02/16   [provider]   ipratropium (ATROVENT) 0.06 % nasal spray Place 2 sprays into both nostrils 4 (four) times daily. 02/05/23   Becky Augusta, NP  Boris Lown Oil 1000 MG CAPS Take by mouth daily.    [provider]  lisinopril (PRINIVIL,ZESTRIL) 10 MG tablet TAKE 1 TABLET(10 MG) BY MOUTH DAILY Patient taking differently: Take 10 mg by mouth daily. TAKE 1 TABLET(10 MG) BY MOUTH DAILY 11/20/17   Eustace Moore, MD  Multiple Vitamin (MULTIVITAMIN) tablet Take 1 tablet by mouth daily.    [provider]  ondansetron (ZOFRAN ODT) 4 MG disintegrating tablet Take 1 tablet (4 mg total) by mouth every 8 (eight) hours as needed for nausea or vomiting. 05/01/21   Petrucelli, Lelon Mast R, PA-C  oxyCODONE-acetaminophen (PERCOCET/ROXICET) 5-325 MG tablet Take 1-2 tablets by mouth every 6 (six) hours as needed for severe pain. 05/01/21   Petrucelli, Samantha R, PA-C  promethazine-dextromethorphan (PROMETHAZINE-DM) 6.25-15 MG/5ML syrup Take 5 mLs by mouth 4 (four) times daily as needed. 02/05/23   Becky Augusta, NP  SUMAtriptan Willette Brace) 6 MG/0.5ML SOLN injection  03/13/16   [provider]  tamsulosin (FLOMAX) 0.4 MG CAPS capsule TAKE 1 CAPSULE(0.4 MG) BY MOUTH DAILY Patient taking differently: Take 0.4 mg by mouth. TAKE 1 CAPSULE(0.4 MG) BY MOUTH DAILY 11/20/17   Eustace Moore, MD  tiZANidine (ZANAFLEX) 4 MG tablet Take 1 tablet by mouth every 8 hours prn spasm 02/03/19   Jiles Harold, PA-C  Wheat Dextrin (BENEFIBER) POWD Take by mouth daily.    [provider]    Family History Family History  Problem Relation Age of Onset   Mental illness Mother        died of trauma   Cancer Father        kidney   Thalassemia Father    Thalassemia Brother    Mental illness Son    Mental illness Brother        opiod overdose   Cancer Paternal Grandfather        stomach   Alcohol abuse Paternal Grandfather     Social History Social History   Tobacco Use   Smoking status: Never   Smokeless  tobacco: Former    Types: Snuff    Quit date: 02/02/1979  Vaping Use   Vaping Use: Never used  Substance Use Topics   Alcohol use: No   Drug use: Never     Allergies   Amlodipine and Nifedipine   Review of Systems Review of Systems Defer to HPI    Physical Exam Triage Vital Signs ED Triage Vitals [02/07/23 1448]  Enc Vitals Group     BP (!) 132/93     Pulse Rate 86     Resp 16     Temp 98.6 F (37 C)     Temp Source Oral  SpO2 95 %     Weight      Height      Head Circumference      Peak Flow      Pain Score 0     Pain Loc      Pain Edu?      Excl. in GC?    No data found.  Updated Vital Signs BP (!) 132/93 (BP Location: Right Arm)   Pulse 86   Temp 98.6 F (37 C) (Oral)   Resp 16   SpO2 95%   Visual Acuity Right Eye Distance:   Left Eye Distance:   Bilateral Distance:    Right Eye Near:   Left Eye Near:    Bilateral Near:     Physical Exam Constitutional:      Appearance: Normal appearance.  HENT:     Mouth/Throat:     Comments: aphthous ulcer present underneath the tongue Eyes:     Extraocular Movements: Extraocular movements intact.  Pulmonary:     Effort: Pulmonary effort is normal.  Skin:    Comments: Picture below shows erythematous macular rash that is present to the upper and lower extremities as well as the abdomen and back  Less than 0.5 cm open blister present to the proximal phalanx of the left second toe  Neurological:     Mental Status: He is alert and oriented to person, place, and time. Mental status is at baseline.      UC Treatments / Results  Labs (all labs ordered are listed, but only abnormal results are displayed) Labs Reviewed - No data to display  EKG   Radiology No results found.  Procedures Procedures (including critical care time)  Medications Ordered in UC Medications - No data to display  Initial Impression / Assessment and Plan / UC Course  I have reviewed the triage vital signs and the  nursing notes.  Pertinent labs & imaging results that were available during my care of the patient were reviewed by me and considered in my medical decision making (see chart for details).  Rash  Unknown etiology, possibly related to antibiotic he denies taking in the past, low suspicion for viral causes upper respiratory symptoms have been present for 2 full weeks, both blisters appear to be benign with no signs of infection advised to monitor and recommended supportive treatments, advise discontinuation of Augmentin and beginning use of azithromycin as well as started treatment with prednisone for management of rash, no current signs of infectious process related to rash presents, advised follow-up if symptoms continue to persist or worsen Final Clinical Impressions(s) / UC Diagnoses   Final diagnoses:  Rash     Discharge Instructions      Today you have been evaluated for your rash, unknown etiology however the only new outlier is introduction of antibiotic and even though you have used this in the past this could be the cause of your rash as no other factors have changed therefore stop use of Augmentin  Begin azithromycin as directed to finish treating respiratory symptoms  Begin prednisone every morning with food for 5 days to reduce the inflammatory process most likely causing the rash as there is no current signs of infection  Blister that is present on the toe and in the mouth appears to be non harmful  Area on the toe may be cleansed with soap and water daily and you may continue to apply Neosporin until healed  May complete salt water gargles for treatment of  mouth blisters, avoid spicy or acidic foods such as citrus as this may sometimes cause irritation  If your rash continues to persist you may follow-up for reevaluation   ED Prescriptions     Medication Sig Dispense Auth. Provider   azithromycin (ZITHROMAX) 250 MG tablet Take 1 tablet (250 mg total) by mouth daily.  Take first 2 tablets together, then 1 every day until finished. 6 tablet Wilferd Ritson R, NP   predniSONE (DELTASONE) 20 MG tablet Take 2 tablets (40 mg total) by mouth daily. 10 tablet Valinda Hoar, NP      PDMP not reviewed this encounter.   Valinda Hoar, [place] 02/07/23 [phone]

## 2023-06-17 ENCOUNTER — Encounter: Payer: Self-pay | Admitting: Emergency Medicine

## 2023-06-17 ENCOUNTER — Ambulatory Visit
Admission: EM | Admit: 2023-06-17 | Discharge: 2023-06-17 | Disposition: A | Discharge status: Home / Self Care | Payer: No Typology Code available for payment source | Attending: Physician Assistant | Admitting: Physician Assistant

## 2023-06-17 DIAGNOSIS — Z87891 Personal history of nicotine dependence: Secondary | ICD-10-CM | POA: Diagnosis not present

## 2023-06-17 DIAGNOSIS — R0982 Postnasal drip: Secondary | ICD-10-CM | POA: Diagnosis not present

## 2023-06-17 DIAGNOSIS — B9789 Other viral agents as the cause of diseases classified elsewhere: Secondary | ICD-10-CM | POA: Diagnosis not present

## 2023-06-17 DIAGNOSIS — R059 Cough, unspecified: Secondary | ICD-10-CM | POA: Diagnosis present

## 2023-06-17 DIAGNOSIS — R0981 Nasal congestion: Secondary | ICD-10-CM | POA: Diagnosis not present

## 2023-06-17 DIAGNOSIS — Z1152 Encounter for screening for COVID-19: Secondary | ICD-10-CM | POA: Diagnosis not present

## 2023-06-17 DIAGNOSIS — R051 Acute cough: Secondary | ICD-10-CM | POA: Diagnosis not present

## 2023-06-17 DIAGNOSIS — J069 Acute upper respiratory infection, unspecified: Secondary | ICD-10-CM

## 2023-06-17 LAB — RESP PANEL BY RT-PCR (FLU A&B, COVID) ARPGX2
Influenza A by PCR: NEGATIVE
Influenza B by PCR: NEGATIVE
SARS Coronavirus 2 by RT PCR: NEGATIVE

## 2023-06-17 MED ORDER — IPRATROPIUM BROMIDE 0.06 % NA SOLN: 2.0000 | Freq: Four times a day (QID) | NASAL | 0 refills | Status: AC

## 2023-06-17 MED ORDER — PROMETHAZINE-DM 6.25-15 MG/5ML PO SYRP: 5.0000 mL | ORAL_SOLUTION | Freq: Four times a day (QID) | ORAL | 0 refills | Status: AC | PRN

## 2023-06-17 NOTE — Discharge Instructions (Signed)
-  Will call if the COVID or flu test is positive.  I will send you a result on MyChart to you. - Likely viral infection.  Will need to run its course which can take a week or 2.  Sometimes it can take longer if it moves into your chest and causes bronchitis. - I sent cough medicine and nasal spray to pharmacy. - If you develop a fever, shortness of breath, chest pain please return for reevaluation. - If you are positive for COVID or flu you should isolate until you are feeling better.

## 2023-06-17 NOTE — ED Provider Notes (Signed)
[facility]    CSN: [id number] Arrival date & time: 06/17/23  1141      History   Chief Complaint Chief Complaint  Patient presents with   Cough   Nasal Congestion   Fever    HPI Tyler Galvan is a 62 y.o. male presenting for cough, congestion, temperature up to 100 degrees, headache, sinus pressure and fatigue x 4 days.  Also reports postnasal drainage.  Denies chest pain, shortness of breath, sore throat, ear pain.  No sick contacts.  Has taken Sudafed over-the-counter without relief.  Patient says he gets this about once a year and thinks it could be 8 sinus infection.  He says he is afraid it could last for a month or longer.  No other complaints.  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    per pt has always had positive skin test since 1980s, but CXR's have always been normal   HLD (hyperlipidemia) 05/10/2016   Hypertension    Migraines    neurologist-- dr Vincente Poli [facility] neurology in [place], [place])   OA (osteoarthritis)    fingers   OSA on CPAP    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    per pt "anything cold to my skin it causes hives and swelling around area"   Wears glasses     Patient Active Problem List   Diagnosis Date Noted   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   HLD (hyperlipidemia) 05/10/2016   Diverticulitis of colon 04/17/2016   Essential hypertension 04/11/2016   Migraine 04/11/2016   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       Home Medications    Prior to Admission medications   Medication Sig Start Date End Date Taking? Authorizing Provider  ipratropium (ATROVENT) 0.06 % nasal spray Place 2 sprays into both nostrils 4 (four) times daily. 06/17/23  Yes Shirlee Latch, PA-C  promethazine-dextromethorphan (PROMETHAZINE-DM) 6.25-15 MG/5ML syrup Take 5 mLs by mouth 4 (four) times daily as needed. 06/17/23  Yes Shirlee Latch, PA-C  aspirin EC 81 MG tablet Take 81 mg by mouth daily.    [provider]  Insulin Syringe-Needle U-100 (INSULIN SYRINGE .5CC/31GX5/16") 31G X 5/16" 0.5 ML MISC TO BE USED WITH SUMATRIPTAN INJECTION 07/02/16   [provider]  Boris Lown Oil 1000 MG CAPS Take by mouth daily.    [provider]  lisinopril (PRINIVIL,ZESTRIL) 10 MG tablet TAKE 1 TABLET(10 MG) BY MOUTH DAILY Patient taking differently: Take 10 mg by  mouth daily. TAKE 1 TABLET(10 MG) BY MOUTH DAILY 11/20/17   Eustace Moore, MD  Multiple Vitamin (MULTIVITAMIN) tablet Take 1 tablet by mouth daily.    [provider]  SUMAtriptan Willette Brace) 6 MG/0.5ML SOLN injection  03/13/16   [provider]  tamsulosin (FLOMAX) 0.4 MG CAPS capsule TAKE 1 CAPSULE(0.4 MG) BY MOUTH DAILY Patient taking differently: Take 0.4 mg by mouth. TAKE 1 CAPSULE(0.4 MG) BY MOUTH DAILY 11/20/17   Eustace Moore, MD  Wheat Dextrin [facility]) POWD Take by mouth daily.    [provider]    Family History Family History  Problem Relation Age of Onset   Mental illness Mother        died of trauma    Cancer Father        kidney   Thalassemia Father    Thalassemia Brother    Mental illness Son    Mental illness Brother        opiod overdose   Cancer Paternal Grandfather        stomach   Alcohol abuse Paternal Grandfather     Social History Social History   Tobacco Use   Smoking status: Never   Smokeless tobacco: Former    Types: Snuff    Quit date: 02/02/1979  Vaping Use   Vaping status: Never Used  Substance Use Topics   Alcohol use: No   Drug use: Never     Allergies   Amlodipine and Nifedipine   Review of Systems Review of Systems  Constitutional:  Positive for fatigue. Negative for fever.  HENT:  Positive for congestion, postnasal drip, rhinorrhea and sinus pressure. Negative for sinus pain and sore throat.   Respiratory:  Positive for cough. Negative for shortness of breath.   Cardiovascular:  Negative for chest pain.  Gastrointestinal:  Negative for abdominal pain, diarrhea, nausea and vomiting.  Musculoskeletal:  Negative for myalgias.  Neurological:  Positive for headaches. Negative for weakness and light-headedness.  Hematological:  Negative for adenopathy.     Physical Exam Triage Vital Signs ED Triage Vitals  Encounter Vitals Group     BP      Systolic BP Percentile      Diastolic BP Percentile      Pulse      Resp      Temp      Temp src      SpO2      Weight      Height      Head Circumference      Peak Flow      Pain Score      Pain Loc      Pain Education      Exclude from Growth Chart    No data found.  Updated Vital Signs BP 124/82 (BP Location: Right Arm)   Pulse 61   Temp 98.3 F (36.8 C) (Oral)   Resp 18   SpO2 95%   Physical Exam Vitals and nursing note reviewed.  Constitutional:      General: He is not in acute distress.    Appearance: Normal appearance. He is well-developed. He is not ill-appearing.  HENT:     Head: Normocephalic and atraumatic.     Right Ear: Tympanic membrane, ear canal and external ear  normal.     Left Ear: Tympanic membrane, ear canal and external ear normal.     Nose: Congestion present.     Mouth/Throat:     Mouth: Mucous membranes are moist.  Pharynx: Oropharynx is clear.  Eyes:     General: No scleral icterus.    Conjunctiva/sclera: Conjunctivae normal.  Cardiovascular:     Rate and Rhythm: Normal rate and regular rhythm.     Heart sounds: Normal heart sounds.  Pulmonary:     Effort: Pulmonary effort is normal. No respiratory distress.     Breath sounds: Normal breath sounds.  Musculoskeletal:     Cervical back: Neck supple.  Skin:    General: Skin is warm and dry.     Capillary Refill: Capillary refill takes less than 2 seconds.  Neurological:     General: No focal deficit present.     Mental Status: He is alert. Mental status is at baseline.     Motor: No weakness.     Gait: Gait normal.  Psychiatric:        Mood and Affect: Mood normal.        Behavior: Behavior normal.      UC Treatments / Results  Labs (all labs ordered are listed, but only abnormal results are displayed) Labs Reviewed  RESP PANEL BY RT-PCR (FLU A&B, COVID) ARPGX2    EKG   Radiology No results found.  Procedures Procedures (including critical care time)  Medications Ordered in UC Medications - No data to display  Initial Impression / Assessment and Plan / UC Course  I have reviewed the triage vital signs and the nursing notes.  Pertinent labs & imaging results that were available during my care of the patient were reviewed by me and considered in my medical decision making (see chart for details).   62 year old male presents for congestion, postnasal drainage, cough, sinus pressure x 4 days.  Denies fever.  Temps have not to 100 degrees maximum.  No sore throat, chest pain, shortness of breath.  No sick contacts.  Vitals normal and stable and the patient is overall well-appearing.  On exam has nasal congestion and posterior pharyngeal erythema slightly with  postnasal drainage.  Chest clear.  COVID/flu test obtained.  Advised patient we will contact him if results are positive.  Reviewed current CDC guidelines, isolation protocol and ED precautions if positive.  Viral URI.  Supportive care encouraged.  Sent Promethazine DM and Atrovent nasal spray to pharmacy.  Explained that symptoms can last for a week or 2, sometimes longer.  Advised to return if higher fever, worsening cough, shortness of breath, chest pain, etc.  Negative flu/COVID.   Final Clinical Impressions(s) / UC Diagnoses   Final diagnoses:  Viral upper respiratory tract infection  Acute cough  Nasal congestion     Discharge Instructions      -Will call if the COVID or flu test is positive.  I will send you a result on MyChart to you. - Likely viral infection.  Will need to run its course which can take a week or 2.  Sometimes it can take longer if it moves into your chest and causes bronchitis. - I sent cough medicine and nasal spray to pharmacy. - If you develop a fever, shortness of breath, chest pain please return for reevaluation. - If you are positive for COVID or flu you should isolate until you are feeling better.     ED Prescriptions     Medication Sig Dispense Auth. Provider   ipratropium (ATROVENT) 0.06 % nasal spray Place 2 sprays into both nostrils 4 (four) times daily. 15 mL Eusebio Friendly B, PA-C   promethazine-dextromethorphan (PROMETHAZINE-DM) 6.25-15 MG/5ML syrup Take 5 mLs by  mouth 4 (four) times daily as needed. 118 mL Shirlee Latch, PA-C      PDMP not reviewed this encounter.   Shirlee Latch, PA-C 06/17/23 1419

## 2023-06-17 NOTE — ED Triage Notes (Signed)
Pt presents with a cough, nasal congestion, fever, headache, sinus pressure x 4 days.

## 2023-06-18 ENCOUNTER — Ambulatory Visit: Payer: Commercial Managed Care - PPO
